# Patient Record
Sex: Male | Born: 1955
Health system: Southern US, Community
[De-identification: ages and names within clinical notes are randomized; demographics above are authoritative.]

## PROBLEM LIST (undated history)

## (undated) DIAGNOSIS — S060X9A Concussion with loss of consciousness of unspecified duration, initial encounter: Secondary | ICD-10-CM

## (undated) DIAGNOSIS — Z87442 Personal history of urinary calculi: Secondary | ICD-10-CM

## (undated) DIAGNOSIS — E7800 Pure hypercholesterolemia, unspecified: Secondary | ICD-10-CM

## (undated) DIAGNOSIS — I10 Essential (primary) hypertension: Secondary | ICD-10-CM

## (undated) DIAGNOSIS — M1990 Unspecified osteoarthritis, unspecified site: Secondary | ICD-10-CM

## (undated) HISTORY — PX: WRIST ARTHROSCOPY: SHX838

## (undated) HISTORY — PX: KNEE ARTHROSCOPY W/ MENISCAL REPAIR: SHX1877

## (undated) HISTORY — PX: APPENDECTOMY: SHX54

---

## 2011-09-18 HISTORY — PX: CARDIAC CATHETERIZATION: SHX172

## 2016-09-25 DIAGNOSIS — Z1389 Encounter for screening for other disorder: Secondary | ICD-10-CM | POA: Diagnosis not present

## 2016-09-25 DIAGNOSIS — M25561 Pain in right knee: Secondary | ICD-10-CM | POA: Diagnosis not present

## 2016-09-25 DIAGNOSIS — M25562 Pain in left knee: Secondary | ICD-10-CM | POA: Diagnosis not present

## 2016-09-25 DIAGNOSIS — I10 Essential (primary) hypertension: Secondary | ICD-10-CM | POA: Diagnosis not present

## 2016-09-25 DIAGNOSIS — K5909 Other constipation: Secondary | ICD-10-CM | POA: Diagnosis not present

## 2016-09-25 LAB — HEPATIC FUNCTION PANEL
ALT: 20 (ref 10–40)
AST: 18 (ref 14–40)
Bilirubin, Total: 0.3

## 2016-09-25 LAB — BASIC METABOLIC PANEL
Potassium: 4.5 (ref 3.4–5.3)
SODIUM: 138 (ref 137–147)

## 2016-09-25 LAB — TSH: TSH: 0.36 — AB (ref 0.41–5.90)

## 2016-11-14 DIAGNOSIS — R05 Cough: Secondary | ICD-10-CM | POA: Diagnosis not present

## 2016-11-14 DIAGNOSIS — J301 Allergic rhinitis due to pollen: Secondary | ICD-10-CM | POA: Diagnosis not present

## 2016-11-14 DIAGNOSIS — T781XXA Other adverse food reactions, not elsewhere classified, initial encounter: Secondary | ICD-10-CM | POA: Diagnosis not present

## 2016-11-14 DIAGNOSIS — J309 Allergic rhinitis, unspecified: Secondary | ICD-10-CM | POA: Diagnosis not present

## 2016-12-16 DIAGNOSIS — S060X9A Concussion with loss of consciousness of unspecified duration, initial encounter: Secondary | ICD-10-CM

## 2016-12-16 DIAGNOSIS — S060XAA Concussion with loss of consciousness status unknown, initial encounter: Secondary | ICD-10-CM

## 2016-12-16 HISTORY — DX: Concussion with loss of consciousness status unknown, initial encounter: S06.0XAA

## 2016-12-16 HISTORY — DX: Concussion with loss of consciousness of unspecified duration, initial encounter: S06.0X9A

## 2016-12-17 DIAGNOSIS — S0501XA Injury of conjunctiva and corneal abrasion without foreign body, right eye, initial encounter: Secondary | ICD-10-CM | POA: Diagnosis not present

## 2016-12-21 DIAGNOSIS — H1045 Other chronic allergic conjunctivitis: Secondary | ICD-10-CM | POA: Diagnosis not present

## 2016-12-26 ENCOUNTER — Ambulatory Visit (INDEPENDENT_AMBULATORY_CARE_PROVIDER_SITE_OTHER): Payer: BLUE CROSS/BLUE SHIELD | Admitting: Family Medicine

## 2016-12-26 ENCOUNTER — Ambulatory Visit (INDEPENDENT_AMBULATORY_CARE_PROVIDER_SITE_OTHER)
Admission: RE | Admit: 2016-12-26 | Discharge: 2016-12-26 | Disposition: A | Discharge status: Home / Self Care | Payer: BLUE CROSS/BLUE SHIELD | Source: Ambulatory Visit | Attending: Family Medicine | Admitting: Family Medicine

## 2016-12-26 ENCOUNTER — Ambulatory Visit
Admission: RE | Admit: 2016-12-26 | Discharge: 2016-12-26 | Disposition: A | Discharge status: Home / Self Care | Payer: BLUE CROSS/BLUE SHIELD | Source: Ambulatory Visit | Attending: Family Medicine | Admitting: Family Medicine

## 2016-12-26 VITALS — BP 100/70 | Ht 69.0 in | Wt 338.0 lb

## 2016-12-26 DIAGNOSIS — S060XAA Concussion with loss of consciousness status unknown, initial encounter: Secondary | ICD-10-CM | POA: Insufficient documentation

## 2016-12-26 DIAGNOSIS — R51 Headache: Principal | ICD-10-CM

## 2016-12-26 DIAGNOSIS — S060X0A Concussion without loss of consciousness, initial encounter: Secondary | ICD-10-CM

## 2016-12-26 DIAGNOSIS — S060X9A Concussion with loss of consciousness of unspecified duration, initial encounter: Secondary | ICD-10-CM | POA: Insufficient documentation

## 2016-12-26 DIAGNOSIS — R519 Headache, unspecified: Secondary | ICD-10-CM

## 2016-12-26 DIAGNOSIS — M47812 Spondylosis without myelopathy or radiculopathy, cervical region: Secondary | ICD-10-CM | POA: Diagnosis not present

## 2016-12-26 DIAGNOSIS — R42 Dizziness and giddiness: Secondary | ICD-10-CM | POA: Diagnosis not present

## 2016-12-26 NOTE — Progress Notes (Signed)
Subjective:     Chief Complaint: [name] Carroll, DOB: [birth year], is a 61 y.o. male who presents for a concussion sustained on Sunday, April 8th. He was reaching behind a file cabinet and hit his head on a shelf. Impact was on anterior/frontal portion of head. Patient did not lose consciousness but did have immediate dizziness and "saw stars". Over the past couple of days, patient has suffered nausea, headache, dizziness, photo and phonophobia, difficultly focusing, and slurring speech. Patient has a history of 5-6 concussions over the past 30 years. Patient had a loss of consciousness with one of his previous concussions. Patient does have a history of migraines and trouble sleeping.   Injury date : 12-23-2016 Visit #: 1  History of Present Illness:  Concussion Self-Reported Symptom Score Symptoms rated on a scale 1-6, in last 24 hours  Headache: 5  Nausea: 2  Vomiting: 0  Balance Difficulty: 5   Dizziness: 3  Fatigue: 5  Trouble Falling Asleep: 4   Sleep More Than Usual: 6  Sleep Less Than Usual: o  Daytime Drowsiness: 4  Photophobia: 4  Phonophobia: 4  Feeling anxious: 0  Confused: 2  Irritability: 0  Sadness: 0  Nervousness: 0  Feeling More Emotional: 0  Numbness or Tingling: 0  Feeling Slowed Down: 5  Feeling Mentally Foggy: 0  Difficulty Concentrating:6  Difficulty Remembering: 5  Visual Problems: 4     Total Symptom Score: 61   Review of Systems: Pertinent items are noted in HPI.  Review of History: Past Medical History: No past medical history on file. Hypertension, chronic reflex, obesity, hypercholesterol  Past Surgical History:  has no past surgical history on file. Not readily available Family History: family history is not on file. No rheumatological diseases Social History:  has no tobacco, alcohol, and drug history on file. Current Medications: has a current medication list which includes the following prescription(s): aspirin, famotidine, lactulose,  levocetirizine, lisinopril, misc natural products, montelukast, multivitamin, phentermine, simvastatin, spironolactone, and verapamil. Allergies: has no allergies on file. No known drug allergies  Objective:    Physical Examination Vitals:   12/26/16 1230  BP: 100/70   General appearance: alert, appears stated age and cooperative Head: Normocephalic, without obvious abnormality, atraumatic Eyes: conjunctivae/corneas clear. Pupils are not equal. Patient is more of a pinpoint pupils on the left side. Does not respond well to light. Sluggish response. Cicatrix rhythm. Lungs: clear to auscultation bilaterally and percussion Heart: regular rate and rhythm, S1, S2 normal, no murmur, click, rub or gallop Neurologic: CN 2-12 normal.  Sensation to pain, touch, and proprioception normal.  DTRs  normal in upper and lower extremities. No pathologic reflexes. Neg rhomberg, modified rhomberg, pronator drift, tandem gait, finger-to-nose; see post-concussion vestibular and oculomotor testing in chart Psychiatric: Oriented X3, intact recent and remote memory, judgement and insight, normal mood and affect mild weakness noted of the left hand but patient states that this is his baseline.  Concussion testing performed today:  Neurocognitive testing (ImPACT):   Post #1: 12/26/16   Verbal Memory Composite  78   Visual Memory Composite  67   Visual Motor Speed Composite  40   Reaction Time Composite  .77   Cognitive Efficiency Index  .24    Vestibular Screening:       Headache  Dizziness  Smooth Pursuits increased No change  H. Saccades increased No change  V. Saccades increased increased  H. VOR No change increased  V. VOR increased increased  Visual Motor Sensitivity increased increased  Accommodation Right: 33 cm Left: 40 cm increased No change  Convergence: 26 cm increased increased     Additional testing performed today:   Assessment:    Intractable headache, unspecified chronicity  pattern, unspecified headache type - Plan: DG Cervical Spine Complete, CT Head Wo Contrast  Jabe Jeanbaptiste presents with the following concussion subtypes. Cognitive Cervical Vestibular Ocular Migraine Anxiety/Mood   Plan:   Action/Discussion: Reviewed diagnosis, management options, expected outcomes, and the reasons for scheduled and emergent follow-up. Questions were adequately answered. Patient expressed verbal understanding and agreement with the following plan.     Patient Education:  Reviewed with patient the risks (i.e, a repeat concussion, post-concussion syndrome, second-impact syndrome) of returning to play prior to complete resolution, and thoroughly reviewed the signs and symptoms of concussion.Reviewed need for complete resolution of all symptoms, with rest AND exertion, prior to return to play.  Reviewed red flags for urgent medical evaluation: worsening symptoms, nausea/vomiting, intractable headache, musculoskeletal changes, focal neurological deficits.  [facility] Concussion Care Plan, which clearly outlines the plans stated above, was given to patient.  I was personally involved with the physical evaluation of and am in agreement with the assessment and treatment plan for this patient.  Greater than 50% of this encounter was spent in direct consultation with the patient in evaluation, counseling, and coordination of care. Duration of encounter: 50 minutes.  After Visit Summary printed out and provided to patient as appropriate.  This note is written by Judi Saa, in the presence of and acting as the scribe of Judi Saa, DO.

## 2016-12-26 NOTE — Assessment & Plan Note (Signed)
Patient did have a concussion longer than 72 hours ago. Continues to have a significant amount of symptoms. Patient's impact testing shows the patient does have significant difficulties with ocular, vestibular mostly. Concern though the patient does have more of a pinpoint pupil on the left side. States that this is different. Patient's headache is all on the left side in constant. Worse with repetitive movement or light. I do believe the patient should have further evaluation with a CT scan at this time. Patient will be scheduled in the next 24 hours. Patient knows if worsening symptoms to seek medical attention immediately. X-rays of neck ordered as well to rule out any other cervical pathology that can be contribute in. Close follow-up in the next 96 hours. Has different medications for breakthrough pain. Patient otherwise will stay out of work until we see him again.

## 2016-12-26 NOTE — Patient Instructions (Signed)
Good to see you I do think you have a concussion.  Out of work until Monday See me then (OK to double book) We need to get a CT of your head and neck xray. Xray today downstairs.  Once the CT is normal then do the following.   To help improve COGNITIVE function: Using fish oil/omega 3 that is 1000 mg (or roughly 600 mg EPA/DHA), starting as soon as possible after concussion, take: 3 tabs THREE TIMES a day  for the first 3 days, then (you will smell a little, sory) 3 tabs TWICE DAILY  for the next 3 days, then 3 tabs ONCE DAILY  for the next 10 days   To help reduce HEADACHES: Coenzyme Q10  ONCE DAILY Riboflavin/Vitamin B2  ONCE DAILY Magnesium oxide  ONCE - TWICE DAILY May stop after headaches are resolved.                                                                                             To help with INSOMNIA: Melatonin 3-5mg  AT BEDTIME   I want to see you again Monday (OK to double book)

## 2016-12-30 NOTE — Progress Notes (Signed)
Subjective:   @  Chief Complaint: [name] Carroll, DOB: 10-Sep-1956, is a 61 y.o. male who presents for follow-up for a concussion. He has had continued problems concentrating, headache and photophobia since his last visit. Head CT was negative.   Chief Complaint  Patient presents with  . Head Injury    Injury date : 4/ Visit #: 2  History of Present Illness:    Concussion Self-Reported Symptom Score Symptoms rated on a scale 1-6, in last 24 hours  Headache:4  Nausea: 2  Vomiting: 1  Balance Difficulty: 1  Dizziness: 1  Fatigue: 4  Trouble Falling Asleep: 0   Sleep More Than Usual: 2  Sleep Less Than Usual: 0  Daytime Drowsiness: 4  Photophobia: 3  Phonophobia: 3 Drowsiness: 4  Irritability: 0  Sadness: 0  Nervousness:2  Feeling More Emotional: 1  Numbness or Tingling: 1  Feeling Slowed Down: 3  Feeling Mentally Foggy: 0  Difficulty Concentrating: 4  Difficulty Remembering: 2  Visual Problems:0   Total Symptom Score: 39 Previous Symptom Score: 61  Review of Systems: Pertinent items are noted in HPI.  Review of History: Past Medical History: @  Past Surgical History:  has no past surgical history on file. Family History: family history is not on file. No family history of rheumatological diseases. Social History:  has no tobacco, alcohol, and drug history on file. Current Medications: has a current medication list which includes the following prescription(s): aspirin, famotidine, lactulose, levocetirizine, lisinopril, misc natural products, montelukast, multivitamin, phentermine, simvastatin, spironolactone, and verapamil. Allergies: has no allergies on file.  Objective:    Physical Examination Vitals:   12/31/16 1341  BP: 110/78  Pulse: 90   General appearance: alert, appears stated age and cooperative Head: Normocephalic, without obvious abnormality, atraumatic Eyes: conjunctivae/corneas clear. PERRL, EOM's intact saccadic movements  still noted. . Fundi benign. Sclera anicteric. Lungs: clear to auscultation bilaterally and percussion speak in full sentences without any shortness of breath Heart: regular rate and rhythm, S1, S2 normal, no murmur, click, rub or gallop Neurologic: CN 2-12 normal.  Sensation to pain, touch, and proprioception normal.  DTRs  normal in upper and lower extremities. No pathologic reflexes. Neg rhomberg, modified rhomberg, pronator drift, tandem gait, finger-to-nose; see post-concussion vestibular and oculomotor testing in chart Psychiatric: Oriented X3, intact recent and remote memory, judgement and insight, normal mood and affect  Concussion testing performed today:  Neurocognitive testing (ImPACT):   Post #2:    Verbal Memory Composite  86 (61%)   Visual Memory Composite  57 (21%)   Visual Motor Speed Composite  36.67 (75%)   Reaction Time Composite  .76 (22%)   Cognitive Efficiency Index  .21      Assessment:    No diagnosis found.  Cobey Raineri presents with the following concussion subtypes. Cognitive Cervical Vestibular Ocular Migraine Anxiety/Mood   Plan:    After Visit Summary printed out and provided to patient as appropriate.  This note is written by Judi Saa, in the presence of and acting as the scribe of Judi Saa, DO.

## 2016-12-31 ENCOUNTER — Ambulatory Visit (INDEPENDENT_AMBULATORY_CARE_PROVIDER_SITE_OTHER): Payer: BLUE CROSS/BLUE SHIELD | Admitting: Family Medicine

## 2016-12-31 DIAGNOSIS — S060X0D Concussion without loss of consciousness, subsequent encounter: Secondary | ICD-10-CM

## 2016-12-31 NOTE — Assessment & Plan Note (Signed)
Patient continues to have symptoms. We have seen some improvement but I do not feel that patient will be sent to work on a regular basis at this point. Patient still having some difficulty with concentration as well as a visual aspect. Patient does do a significant amount of difficulty with sitting at a computer and he did not think that patient would be to do this for 8 hours a day. Patient will be held out from another 72 hours or we will reevaluate again. Encourage him to continue the other therapies at this time. I believe the patient will improve we will need to be slowly. Once again follow-up in 72 hours.

## 2016-12-31 NOTE — Patient Instructions (Signed)
Good to see you  Not there yet Continue with the plan  See me again in

## 2017-01-03 ENCOUNTER — Ambulatory Visit (INDEPENDENT_AMBULATORY_CARE_PROVIDER_SITE_OTHER): Payer: BLUE CROSS/BLUE SHIELD | Admitting: Family Medicine

## 2017-01-03 DIAGNOSIS — S060X0D Concussion without loss of consciousness, subsequent encounter: Secondary | ICD-10-CM

## 2017-01-03 NOTE — Progress Notes (Signed)
Eat Subjective:   @  Chief Complaint: [name] Carroll, DOB: [birth year], is a 61 y.o. male who presents for follow-up for a concussion. Patient is here for third follow-up. Is doing significantly better at this time. No significant findings. Feels like himself over the last 24 hours.   Injury date : 4/ Visit #: 3  History of Present Illness:    Concussion Self-Reported Symptom Score Symptoms rated on a scale 1-6, in last 24 hours  Headache:0  Nausea: 0  Vomiting: 0  Balance Difficulty: 0  Dizziness: 0  Fatigue: 0  Trouble Falling Asleep: 0   Sleep More Than Usual: 0  Sleep Less Than Usual: 0  Daytime Drowsiness: 0  Photophobia: 0  Phonophobia: 0 Drowsiness: 0  Irritability: 0  Sadness: 0  Nervousness:0  Feeling More Emotional: 0  Numbness or Tingling: 0  Feeling Slowed Down: 0  Feeling Mentally Foggy: 0  Difficulty Concentrating: 0  Difficulty Remembering: 0  Visual Problems:0   Total Symptom Score: 0 Previous Symptom Score: 39  Review of Systems: Pertinent items are noted in HPI.  Review of History: Past Medical History: @  Past Surgical History:  has no past surgical history on file. Family History: family history is not on file. No family history of rheumatological diseases. Social History:  has no tobacco, alcohol, and drug history on file. Current Medications: has a current medication list which includes the following prescription(s): aspirin, famotidine, lactulose, levocetirizine, lisinopril, misc natural products, montelukast, multivitamin, phentermine, simvastatin, spironolactone, and verapamil. Allergies: has no allergies on file.  Objective:    Physical Examination Vitals:   01/03/17 1242  BP: 108/84  Pulse: 82   General appearance: alert, appears stated age and cooperative Head: Normocephalic, without obvious abnormality, atraumatic Eyes: conjunctivae/corneas clear. PERRL, EOM's intact saccadic movements still noted. . Fundi  benign. Sclera anicteric. Lungs: clear to auscultation bilaterally and percussion speak in full sentences without any shortness of breath Heart: regular rate and rhythm, S1, S2 normal, no murmur, click, rub or gallop Neurologic: CN 2-12 normal.  Sensation to pain, touch, and proprioception normal.  DTRs  normal in upper and lower extremities. No pathologic reflexes. Neg rhomberg, modified rhomberg, pronator drift, tandem gait, finger-to-nose; see post-concussion vestibular and oculomotor testing in chart Psychiatric: Oriented X3, intact recent and remote memory, judgement and insight, normal mood and affect  Concussion testing performed today:  Neurocognitive testing (ImPACT):   Post #2:  Post #3  Verbal Memory Composite  86 (61%) 79 (41%)  Visual Memory Composite  57 (21%) 63 (33%)  Visual Motor Speed Composite  36.67 (75%) 41.28 (90%)  Reaction Time Composite  .76 (22%) .69 (40%)  Cognitive Efficiency Index  .21 .24     Assessment:    No diagnosis found. Resolved  [name] Carroll presents with the following concussion subtypes. Cognitive Cervical Vestibular Ocular Migraine Anxiety/Mood   Plan:    After Visit Summary printed out and provided to patient as appropriate.  This note is written by Judi Saa, in the presence of and acting as the scribe of Judi Saa, DO.

## 2017-01-03 NOTE — Assessment & Plan Note (Signed)
Patient seems to be asymptomatic at this time. Responded well to conservative therapy otherwise. Is able to return to work starting tomorrow. Follow-up again as needed.

## 2017-01-03 NOTE — Patient Instructions (Signed)
Great to see you  You are great  See me when you need me Fish oil 2 grams daily

## 2017-01-15 ENCOUNTER — Ambulatory Visit: Payer: Self-pay | Admitting: Family Medicine

## 2017-03-14 DIAGNOSIS — Z125 Encounter for screening for malignant neoplasm of prostate: Secondary | ICD-10-CM | POA: Diagnosis not present

## 2017-03-14 DIAGNOSIS — I10 Essential (primary) hypertension: Secondary | ICD-10-CM | POA: Diagnosis not present

## 2017-03-14 DIAGNOSIS — Z0000 Encounter for general adult medical examination without abnormal findings: Secondary | ICD-10-CM | POA: Diagnosis not present

## 2017-03-14 DIAGNOSIS — E784 Other hyperlipidemia: Secondary | ICD-10-CM | POA: Diagnosis not present

## 2017-03-14 LAB — BASIC METABOLIC PANEL
BUN: 15 (ref 4–21)
CREATININE: 1.3 (ref <=1.3)
Glucose: 97
POTASSIUM: 5.1 (ref 3.4–5.3)
Sodium: 135 — AB (ref 137–147)

## 2017-03-14 LAB — CBC AND DIFFERENTIAL
HCT: 50 (ref 41–53)
HEMATOCRIT: 50 (ref 41–53)
Hemoglobin: 16.1 (ref 13.5–17.5)
Hemoglobin: 16.1 (ref 13.5–17.5)
PLATELETS: 167 (ref 150–399)
WBC: 135
WBC: 6.2

## 2017-03-14 LAB — HEPATIC FUNCTION PANEL
ALK PHOS: 80 (ref 25–125)
ALT: 32 (ref 10–40)
AST: 29 (ref 14–40)
BILIRUBIN, TOTAL: 0.6

## 2017-03-14 LAB — LIPID PANEL
Cholesterol: 237 — AB (ref 0–200)
HDL: 39 (ref 35–70)
LDL Cholesterol: 162
TRIGLYCERIDES: 178 — AB (ref 40–160)

## 2017-03-14 LAB — PSA: PSA: 0.775

## 2017-03-21 DIAGNOSIS — R202 Paresthesia of skin: Secondary | ICD-10-CM | POA: Diagnosis not present

## 2017-03-21 DIAGNOSIS — E784 Other hyperlipidemia: Secondary | ICD-10-CM | POA: Diagnosis not present

## 2017-03-21 DIAGNOSIS — Z0000 Encounter for general adult medical examination without abnormal findings: Secondary | ICD-10-CM | POA: Diagnosis not present

## 2017-03-21 DIAGNOSIS — M1990 Unspecified osteoarthritis, unspecified site: Secondary | ICD-10-CM | POA: Diagnosis not present

## 2017-03-25 DIAGNOSIS — Z1212 Encounter for screening for malignant neoplasm of rectum: Secondary | ICD-10-CM | POA: Diagnosis not present

## 2017-03-25 LAB — IFOBT (OCCULT BLOOD): IMMUNOLOGICAL FECAL OCCULT BLOOD TEST: NEGATIVE

## 2017-04-11 ENCOUNTER — Encounter (INDEPENDENT_AMBULATORY_CARE_PROVIDER_SITE_OTHER): Payer: Self-pay | Admitting: Orthopedic Surgery

## 2017-04-11 ENCOUNTER — Ambulatory Visit (INDEPENDENT_AMBULATORY_CARE_PROVIDER_SITE_OTHER): Payer: BLUE CROSS/BLUE SHIELD | Admitting: Orthopedic Surgery

## 2017-04-11 ENCOUNTER — Ambulatory Visit (INDEPENDENT_AMBULATORY_CARE_PROVIDER_SITE_OTHER): Payer: Self-pay

## 2017-04-11 VITALS — Ht 69.0 in | Wt 342.0 lb

## 2017-04-11 DIAGNOSIS — M25561 Pain in right knee: Secondary | ICD-10-CM | POA: Diagnosis not present

## 2017-04-11 DIAGNOSIS — M170 Bilateral primary osteoarthritis of knee: Secondary | ICD-10-CM | POA: Diagnosis not present

## 2017-04-11 DIAGNOSIS — M25562 Pain in left knee: Secondary | ICD-10-CM

## 2017-04-11 DIAGNOSIS — G8929 Other chronic pain: Secondary | ICD-10-CM | POA: Diagnosis not present

## 2017-04-11 NOTE — Progress Notes (Signed)
   Office Visit Note   Patient: [name] ManesRoger Ungerer           Date of Birth: 1956/07/13           MRN: [id number] Visit Date: 04/11/2017              Requested by: No referring provider defined for this encounter. PCP: Jarome MatinPaterson, [name], MD  Chief Complaint  Patient presents with  . Left Knee - Pain  . Right Knee - Pain      HPI: Patient is a 61 year old gentleman with chronic osteoarthritis both knees right worse than left. Patient has pain with activities of daily living he states his knees give out he falls he has difficulty going up and down stairs he has had steroid injections without relief has used anti-inflammatories without relief. Patient complains of crepitation with range of motion.  Patient denies history of diabetes denies history of sleep apnea.  Assessment & Plan: Visit Diagnoses:  1. Chronic pain of right knee   2. Chronic pain of left knee   3. Bilateral primary osteoarthritis of knee     Plan: Patient states that due to failure of conservative treatment he would like to proceed with total knee arthroplasty. We'll set him up for a right total knee arthroplasty risk and benefits were discussed patient states he understands wishes to proceed at this time patient states he has good family support and will not need discharge to skilled nursing.  Follow-Up Instructions: Return in about 2 weeks (around 04/25/2017).   Ortho Exam  Patient is alert, oriented, no adenopathy, well-dressed, normal affect, normal respiratory effort. Examination patient has an antalgic gait. He is crepitation range of motion of both knees he has valgus alignment to both knees with tenderness to palpation the patellofemoral joint as well as medial lateral joint does have a mild effusion. There is no skin breakdown.  Imaging: Xr Knee 1-2 Views Left  Result Date: 04/11/2017 2 view radiographs of left knee shows tricompartmental arthritic spurs joint space narrowing valgus alignment with subcondylar  sclerosis.  Xr Knee 1-2 Views Right  Result Date: 04/11/2017 2 view radiographs of the right knee shows tricompartmental osteoarthritis with bony spurs subcondylar sclerosis and valgus alignment with joint space narrowing of all compartments.   Labs: No results found for: HGBA1C, ESRSEDRATE, CRP, LABURIC, REPTSTATUS, GRAMSTAIN, CULT, LABORGA  Orders:  Orders Placed This Encounter  Procedures  . XR Knee 1-2 Views Right  . XR Knee 1-2 Views Left   No orders of the defined types were placed in this encounter.    Procedures: No procedures performed  Clinical Data: No additional findings.  ROS:  All other systems negative, except as noted in the HPI. Review of Systems  Objective: Vital Signs: Ht 5\' 9"  (1.753 m)   Wt (!) 342 lb (155.1 kg)   BMI 50.50 kg/m   Specialty Comments:  No specialty comments available.  PMFS History: Patient Active Problem List   Diagnosis Date Noted  . Bilateral primary osteoarthritis of knee 04/11/2017  . Concussion 12/26/2016   History reviewed. No pertinent past medical history.  History reviewed. No pertinent family history.  History reviewed. No pertinent surgical history. Social History   Occupational History  . Not on file.   Social History Main Topics  . Smoking status: Never Smoker  . Smokeless tobacco: Never Used  . Alcohol use Not on file  . Drug use: Unknown  . Sexual activity: Not on file

## 2017-04-19 ENCOUNTER — Telehealth (INDEPENDENT_AMBULATORY_CARE_PROVIDER_SITE_OTHER): Payer: Self-pay | Admitting: Orthopedic Surgery

## 2017-04-19 NOTE — Telephone Encounter (Signed)
Patient called needing a letter stating when he is having surgery and how long he will be out of work after the surgery for his job. Please give pt a call when this letter is ready.

## 2017-04-22 NOTE — Telephone Encounter (Signed)
Recommend out of work for 4 weeks could allow him to return to work sooner if he feels comfortable

## 2017-04-23 ENCOUNTER — Encounter: Payer: Self-pay | Admitting: Internal Medicine

## 2017-04-23 NOTE — Telephone Encounter (Signed)
Cancel patient's surgery and let him know that we can rebook his surgery at his convenience. Patient's out of work time is dependent upon his progress with therapy.

## 2017-04-23 NOTE — Telephone Encounter (Signed)
I advised patient of message below. He would like to cancel surgery. He states there is no way he could be out of work for 4 weeks. I advised him this was just an estimate and he maybe able to return much sooner, but we could not anticipate how he would be feeling before the surgery has happened. Advised him that rehab for a total joint replacement is involved. He will need to be doing physical therapy within his home and outside of his home. He explained he has to work 40 hours a week no matter what. I advised him I would let you and Elnita MaxwellCheryl know.

## 2017-04-29 NOTE — Telephone Encounter (Signed)
Patient called back and advised me to not cancel surgery. He would like to proceed as scheduled for Sept.

## 2017-05-02 ENCOUNTER — Other Ambulatory Visit (INDEPENDENT_AMBULATORY_CARE_PROVIDER_SITE_OTHER): Payer: Self-pay | Admitting: Family

## 2017-05-13 ENCOUNTER — Other Ambulatory Visit (INDEPENDENT_AMBULATORY_CARE_PROVIDER_SITE_OTHER): Payer: Self-pay | Admitting: Family

## 2017-05-16 ENCOUNTER — Other Ambulatory Visit (INDEPENDENT_AMBULATORY_CARE_PROVIDER_SITE_OTHER): Payer: Self-pay | Admitting: Orthopedic Surgery

## 2017-05-31 ENCOUNTER — Encounter (HOSPITAL_COMMUNITY): Payer: Self-pay

## 2017-05-31 ENCOUNTER — Encounter (HOSPITAL_COMMUNITY)
Admission: RE | Admit: 2017-05-31 | Discharge: 2017-05-31 | Disposition: A | Discharge status: Home / Self Care | Payer: BLUE CROSS/BLUE SHIELD | Source: Ambulatory Visit | Attending: Orthopedic Surgery | Admitting: Orthopedic Surgery

## 2017-05-31 DIAGNOSIS — S060X9A Concussion with loss of consciousness of unspecified duration, initial encounter: Secondary | ICD-10-CM | POA: Diagnosis not present

## 2017-05-31 DIAGNOSIS — Z01812 Encounter for preprocedural laboratory examination: Secondary | ICD-10-CM | POA: Insufficient documentation

## 2017-05-31 DIAGNOSIS — M170 Bilateral primary osteoarthritis of knee: Secondary | ICD-10-CM | POA: Insufficient documentation

## 2017-05-31 DIAGNOSIS — Z01810 Encounter for preprocedural cardiovascular examination: Secondary | ICD-10-CM | POA: Diagnosis not present

## 2017-05-31 DIAGNOSIS — I10 Essential (primary) hypertension: Secondary | ICD-10-CM | POA: Diagnosis not present

## 2017-05-31 HISTORY — DX: Essential (primary) hypertension: I10

## 2017-05-31 HISTORY — DX: Personal history of urinary calculi: Z87.442

## 2017-05-31 HISTORY — DX: Unspecified osteoarthritis, unspecified site: M19.90

## 2017-05-31 LAB — BASIC METABOLIC PANEL
ANION GAP: 8 (ref 5–15)
BUN: 15 mg/dL (ref 6–20)
CALCIUM: 9.2 mg/dL (ref 8.9–10.3)
CO2: 24 mmol/L (ref 22–32)
CREATININE: 1.34 mg/dL — AB (ref 0.61–1.24)
Chloride: 100 mmol/L — ABNORMAL LOW (ref 101–111)
GFR calc Af Amer: >60 mL/min (ref >60)
GFR, EST NON AFRICAN AMERICAN: 56 mL/min — AB (ref >60)
Glucose, Bld: 97 mg/dL (ref 65–99)
POTASSIUM: 4 mmol/L (ref 3.5–5.1)
Sodium: 132 mmol/L — ABNORMAL LOW (ref 135–145)

## 2017-05-31 LAB — CBC
HEMATOCRIT: 45.5 % (ref 39.0–52.0)
Hemoglobin: 14.9 g/dL (ref 13.0–17.0)
MCH: 28 pg (ref 26.0–34.0)
MCHC: 32.7 g/dL (ref 30.0–36.0)
MCV: 85.5 fL (ref 78.0–100.0)
Platelets: 167 10*3/uL (ref 150–400)
RBC: 5.32 MIL/uL (ref 4.22–5.81)
RDW: 14.7 % (ref 11.5–15.5)
WBC: 5.9 10*3/uL (ref 4.0–10.5)

## 2017-05-31 LAB — SURGICAL PCR SCREEN
MRSA, PCR: NEGATIVE
STAPHYLOCOCCUS AUREUS: NEGATIVE

## 2017-05-31 NOTE — Progress Notes (Signed)
PCP: Dr. Jarome Matin  Cardiologist: pt denies  EKG: denies past year  Stress test: 20 + years ago  ECHO: 2015 in [place] (requested)  Cardiac Cath:2015 in [place] (requested)  Chest x-ray: denies past year, no recent respiratory complications/infections

## 2017-05-31 NOTE — Pre-Procedure Instructions (Signed)
[name] Carroll  05/31/2017      [id number] [place] - [address] [place] [address] Phone: [phone] Fax: [phone]    Your procedure is scheduled on June 12, 2017.  Report to [facility] Admitting at 530 AM.  Call this number if you have problems the morning of surgery:  [phone]   Remember:  Do not eat food or drink liquids after midnight.  Take these medicines the morning of surgery with A SIP OF WATER azelastine (astelin) 0.1% nasal spray, famotidine (pepcid).   7 days prior to surgery STOP taking any Aspirin, Aleve, Naproxen, Ibuprofen, Motrin, Advil, Goody's, BC's, all herbal medications, fish oil, and all vitamins   Do not wear jewelry, make-up or nail polish.  Do not wear lotions, powders, or perfumes, or deoderant.  Men may shave face and neck.  Do not bring valuables to the hospital.  [facility] is not responsible for any belongings or valuables.  Contacts, dentures or bridgework may not be worn into surgery.  Leave your suitcase in the car.  After surgery it may be brought to your room.  For patients admitted to the hospital, discharge time will be determined by your treatment team.  Patients discharged the day of surgery will not be allowed to drive home.   Special instructions:   [facility]- Preparing For Surgery  Before surgery, you can play an important role. Because skin is not sterile, your skin needs to be as free of germs as possible. You can reduce the number of germs on your skin by washing with CHG (chlorahexidine gluconate) Soap before surgery.  CHG is an antiseptic cleaner which kills germs and bonds with the skin to continue killing germs even after washing.  Please do not use if you have an allergy to CHG or antibacterial soaps. If your skin becomes reddened/irritated stop using the CHG.  Do not shave (including legs and underarms) for at least 48 hours prior to first CHG  shower. It is OK to shave your face.  Please follow these instructions carefully.   1. Shower the NIGHT BEFORE SURGERY and the MORNING OF SURGERY with CHG.   2. If you chose to wash your hair, wash your hair first as usual with your normal shampoo.  3. After you shampoo, rinse your hair and body thoroughly to remove the shampoo.  4. Use CHG as you would any other liquid soap. You can apply CHG directly to the skin and wash gently with a scrungie or a clean washcloth.   5. Apply the CHG Soap to your body ONLY FROM THE NECK DOWN.  Do not use on open wounds or open sores. Avoid contact with your eyes, ears, mouth and genitals (private parts). Wash genitals (private parts) with your normal soap.  6. Wash thoroughly, paying special attention to the area where your surgery will be performed.  7. Thoroughly rinse your body with warm water from the neck down.  8. DO NOT shower/wash with your normal soap after using and rinsing off the CHG Soap.  9. Pat yourself dry with a CLEAN TOWEL.   10. Wear CLEAN PAJAMAS   11. Place CLEAN SHEETS on your bed the night of your first shower and DO NOT SLEEP WITH PETS.    Day of Surgery: Do not apply any deodorants/lotions. Please wear clean clothes to the hospital/surgery center.     Please read over the following fact sheets that you  were given. Pain Booklet, Coughing and Deep Breathing, MRSA Information and Surgical Site Infection Prevention

## 2017-06-02 DIAGNOSIS — Z23 Encounter for immunization: Secondary | ICD-10-CM | POA: Diagnosis not present

## 2017-06-03 NOTE — Progress Notes (Addendum)
Anesthesia Chart Review:  Pt is a 61 year old male scheduled for R total knee arthroplasty on 06/12/2017 with Aldean Baker, MD  PMH includes:  HTN. Never smoker. BMI 52  Medications include: ASA 81 mg, Vytorin, Pepcid, lisinopril, Adipex, spironolactone, verapamil.  Pt stopped adipex a week ago.    Preoperative labs reviewed.    EKG 05/31/17: NSR. Inferior infarct, age undetermined.  Appears stable when compared to EKG 02/08/13 [facility], [place], [place])  Echo 02/09/13 ([facility], [place]): 1. Technically difficult study with suboptimal views. 2. LV size normal. Mild concentric LVH. Overall LV systolic function normal, EF 55-60%. Diastolic filling pattern indicates impaired relaxation. 3. LA mildly dilated. 4. RV mildly enlarged measuring between 3.4 3.7 cm. RV systolic function normal.  Cardiac cath [facility], [place], [place]):  1. RCA: Dominant. Free of significant disease. Distally divides into PDA and PL branches which are free of significant flow-limiting lesion. 2. LM: A short segment. Free of significant disease. 3. LAD: Free of significant disease. It is tortuous. Distally the LAD supplies apex. It gives rise to 2 diagonals, which are free of significant disease. 4. CX: Tortuous. Free of significant disease. Gives rise to to OM vessels. OM1 gives rise to superior and inferior branch, free of significant disease. AV groove circumflex continues as the second OM and is free of significant flow-limiting lesion. 5. Left ventriculogram: Normal LV size and function was mildly dilated LV with normal LV function. Estimated EF 65% with LVEDP of 18. Conclusion: Normal coronaries. Noncardiac chest pain workup  If no changes, I anticipate pt can proceed with surgery as scheduled.   Rica Mast, FNP-BC [facility]/Anesthesiology Phone: [phone] 06/03/2017 4:44 PM

## 2017-06-11 ENCOUNTER — Encounter (HOSPITAL_COMMUNITY): Payer: Self-pay | Admitting: Anesthesiology

## 2017-06-11 MED ORDER — CEFAZOLIN SODIUM 10 G IJ SOLR
3.0000 g | INTRAMUSCULAR | Status: AC
  Administered 2017-06-12: 3 g via INTRAVENOUS
  Filled 2017-06-11: qty 3000

## 2017-06-11 NOTE — Anesthesia Preprocedure Evaluation (Addendum)
Anesthesia Evaluation  Patient identified by MRN, date of birth, ID band Patient awake    Reviewed: Allergy & Precautions, NPO status , Patient's Chart, lab work & pertinent test results  Airway Mallampati: II  TM Distance: >3 FB Neck ROM: Full    Dental  (+) Upper Dentures, Partial Lower   Pulmonary neg pulmonary ROS,    breath sounds clear to auscultation       Cardiovascular hypertension, Pt. on medications  Rhythm:Regular Rate:Normal     Neuro/Psych negative neurological ROS     GI/Hepatic negative GI ROS, Neg liver ROS,   Endo/Other  negative endocrine ROS  Renal/GU negative Renal ROS     Musculoskeletal  (+) Arthritis ,   Abdominal   Peds  Hematology negative hematology ROS (+)   Anesthesia Other Findings Day of surgery medications reviewed with the patient.  Reproductive/Obstetrics                            Anesthesia Physical Anesthesia Plan  ASA: III  Anesthesia Plan: General   Post-op Pain Management:  Regional for Post-op pain   Induction: Intravenous  PONV Risk Score and Plan: 3 and Ondansetron, Dexamethasone, Midazolam and Treatment may vary due to age or medical condition  Airway Management Planned: Oral ETT  Additional Equipment:   Intra-op Plan:   Post-operative Plan: Extubation in OR  Informed Consent: I have reviewed the patients History and Physical, chart, labs and discussed the procedure including the risks, benefits and alternatives for the proposed anesthesia with the patient or authorized representative who has indicated his/her understanding and acceptance.   Dental advisory given  Plan Discussed with: CRNA  Anesthesia Plan Comments:        Anesthesia Quick Evaluation

## 2017-06-12 ENCOUNTER — Inpatient Hospital Stay (HOSPITAL_COMMUNITY): Payer: BLUE CROSS/BLUE SHIELD | Admitting: Emergency Medicine

## 2017-06-12 ENCOUNTER — Inpatient Hospital Stay (HOSPITAL_COMMUNITY)
Admission: RE | Admit: 2017-06-12 | Discharge: 2017-06-14 | DRG: 470 | Disposition: A | Discharge status: Home / Self Care | Payer: BLUE CROSS/BLUE SHIELD | Source: Ambulatory Visit | Attending: Orthopedic Surgery | Admitting: Orthopedic Surgery

## 2017-06-12 ENCOUNTER — Encounter (HOSPITAL_COMMUNITY)
Admission: RE | Disposition: A | Discharge status: Home / Self Care | Payer: Self-pay | Source: Ambulatory Visit | Attending: Orthopedic Surgery

## 2017-06-12 ENCOUNTER — Inpatient Hospital Stay (HOSPITAL_COMMUNITY): Payer: BLUE CROSS/BLUE SHIELD | Admitting: Anesthesiology

## 2017-06-12 ENCOUNTER — Encounter (HOSPITAL_COMMUNITY): Payer: Self-pay | Admitting: Urology

## 2017-06-12 DIAGNOSIS — Z7951 Long term (current) use of inhaled steroids: Secondary | ICD-10-CM

## 2017-06-12 DIAGNOSIS — Z7982 Long term (current) use of aspirin: Secondary | ICD-10-CM

## 2017-06-12 DIAGNOSIS — Z87442 Personal history of urinary calculi: Secondary | ICD-10-CM | POA: Diagnosis not present

## 2017-06-12 DIAGNOSIS — M879 Osteonecrosis, unspecified: Secondary | ICD-10-CM | POA: Diagnosis not present

## 2017-06-12 DIAGNOSIS — S060X9A Concussion with loss of consciousness of unspecified duration, initial encounter: Secondary | ICD-10-CM | POA: Diagnosis not present

## 2017-06-12 DIAGNOSIS — Z96651 Presence of right artificial knee joint: Secondary | ICD-10-CM

## 2017-06-12 DIAGNOSIS — Z882 Allergy status to sulfonamides status: Secondary | ICD-10-CM

## 2017-06-12 DIAGNOSIS — M1711 Unilateral primary osteoarthritis, right knee: Secondary | ICD-10-CM

## 2017-06-12 DIAGNOSIS — M170 Bilateral primary osteoarthritis of knee: Secondary | ICD-10-CM | POA: Diagnosis not present

## 2017-06-12 DIAGNOSIS — G8918 Other acute postprocedural pain: Secondary | ICD-10-CM | POA: Diagnosis not present

## 2017-06-12 DIAGNOSIS — M25561 Pain in right knee: Secondary | ICD-10-CM | POA: Diagnosis not present

## 2017-06-12 DIAGNOSIS — I10 Essential (primary) hypertension: Secondary | ICD-10-CM | POA: Diagnosis present

## 2017-06-12 HISTORY — PX: TOTAL KNEE ARTHROPLASTY: SHX125

## 2017-06-12 HISTORY — DX: Pure hypercholesterolemia, unspecified: E78.00

## 2017-06-12 HISTORY — DX: Concussion with loss of consciousness of unspecified duration, initial encounter: S06.0X9A

## 2017-06-12 SURGERY — ARTHROPLASTY, KNEE, TOTAL
Anesthesia: General | Site: Knee | Laterality: Right

## 2017-06-12 MED ORDER — PHENOL 1.4 % MT LIQD: 1.0000 | OROMUCOSAL | Status: DC | PRN

## 2017-06-12 MED ORDER — SODIUM CHLORIDE 0.9 % IR SOLN
Status: DC | PRN
  Administered 2017-06-12: 3000 mL

## 2017-06-12 MED ORDER — VERAPAMIL HCL ER 240 MG PO TBCR
240.0000 mg | EXTENDED_RELEASE_TABLET | Freq: Every day | ORAL | Status: DC
  Administered 2017-06-12 – 2017-06-13 (×2): 240 mg via ORAL
  Filled 2017-06-12 (×2): qty 1

## 2017-06-12 MED ORDER — ONDANSETRON HCL 4 MG/2ML IJ SOLN
INTRAMUSCULAR | Status: DC | PRN
  Administered 2017-06-12: 4 mg via INTRAVENOUS

## 2017-06-12 MED ORDER — METOCLOPRAMIDE HCL 5 MG PO TABS: 5.0000 mg | ORAL_TABLET | Freq: Three times a day (TID) | ORAL | Status: DC | PRN

## 2017-06-12 MED ORDER — SUCCINYLCHOLINE CHLORIDE 200 MG/10ML IV SOSY
PREFILLED_SYRINGE | INTRAVENOUS | Status: AC
  Filled 2017-06-12: qty 10

## 2017-06-12 MED ORDER — ACETAMINOPHEN 325 MG PO TABS: 650.0000 mg | ORAL_TABLET | Freq: Four times a day (QID) | ORAL | Status: DC | PRN

## 2017-06-12 MED ORDER — ONDANSETRON HCL 4 MG/2ML IJ SOLN
4.0000 mg | Freq: Four times a day (QID) | INTRAMUSCULAR | Status: DC | PRN
  Administered 2017-06-13 – 2017-06-14 (×2): 4 mg via INTRAVENOUS
  Filled 2017-06-12 (×2): qty 2

## 2017-06-12 MED ORDER — MIDAZOLAM HCL 2 MG/2ML IJ SOLN
INTRAMUSCULAR | Status: DC | PRN
  Administered 2017-06-12: 2 mg via INTRAVENOUS

## 2017-06-12 MED ORDER — HYDROMORPHONE HCL 1 MG/ML IJ SOLN
0.2500 mg | INTRAMUSCULAR | Status: DC | PRN
  Administered 2017-06-12: 0.5 mg via INTRAVENOUS
  Administered 2017-06-12 (×2): 0.25 mg via INTRAVENOUS

## 2017-06-12 MED ORDER — EZETIMIBE-SIMVASTATIN 10-40 MG PO TABS: 1.0000 | ORAL_TABLET | Freq: Every day | ORAL | Status: DC

## 2017-06-12 MED ORDER — PROPOFOL 10 MG/ML IV BOLUS
INTRAVENOUS | Status: AC
  Filled 2017-06-12: qty 20

## 2017-06-12 MED ORDER — ONDANSETRON HCL 4 MG PO TABS: 4.0000 mg | ORAL_TABLET | Freq: Four times a day (QID) | ORAL | Status: DC | PRN

## 2017-06-12 MED ORDER — CEFAZOLIN SODIUM-DEXTROSE 2-4 GM/100ML-% IV SOLN
2.0000 g | Freq: Four times a day (QID) | INTRAVENOUS | Status: AC
  Administered 2017-06-12 (×2): 2 g via INTRAVENOUS
  Filled 2017-06-12 (×2): qty 100

## 2017-06-12 MED ORDER — METHOCARBAMOL 500 MG PO TABS
ORAL_TABLET | ORAL | Status: AC
  Filled 2017-06-12: qty 1

## 2017-06-12 MED ORDER — PROPOFOL 10 MG/ML IV BOLUS
INTRAVENOUS | Status: AC
  Filled 2017-06-12: qty 40

## 2017-06-12 MED ORDER — ROPIVACAINE HCL 5 MG/ML IJ SOLN
INTRAMUSCULAR | Status: DC | PRN
  Administered 2017-06-12: 30 mL via PERINEURAL

## 2017-06-12 MED ORDER — ASPIRIN EC 325 MG PO TBEC
325.0000 mg | DELAYED_RELEASE_TABLET | Freq: Every day | ORAL | Status: DC
  Administered 2017-06-13 – 2017-06-14 (×2): 325 mg via ORAL
  Filled 2017-06-12 (×2): qty 1

## 2017-06-12 MED ORDER — EZETIMIBE 10 MG PO TABS
10.0000 mg | ORAL_TABLET | Freq: Every day | ORAL | Status: DC
  Administered 2017-06-12 – 2017-06-13 (×2): 10 mg via ORAL
  Filled 2017-06-12 (×2): qty 1

## 2017-06-12 MED ORDER — FAMOTIDINE 20 MG PO TABS
20.0000 mg | ORAL_TABLET | Freq: Every day | ORAL | Status: DC
  Administered 2017-06-12 – 2017-06-13 (×2): 20 mg via ORAL
  Filled 2017-06-12 (×2): qty 1

## 2017-06-12 MED ORDER — OXYCODONE HCL 5 MG PO TABS
5.0000 mg | ORAL_TABLET | ORAL | Status: DC | PRN
  Administered 2017-06-12 – 2017-06-14 (×13): 10 mg via ORAL
  Filled 2017-06-12 (×12): qty 2

## 2017-06-12 MED ORDER — HYDROMORPHONE HCL 1 MG/ML IJ SOLN
1.0000 mg | INTRAMUSCULAR | Status: DC | PRN
Start: 2017-06-12 — End: 2017-06-14
  Administered 2017-06-13 – 2017-06-14 (×4): 1 mg via INTRAVENOUS
  Filled 2017-06-12 (×4): qty 1

## 2017-06-12 MED ORDER — DEXAMETHASONE SODIUM PHOSPHATE 10 MG/ML IJ SOLN
INTRAMUSCULAR | Status: AC
  Filled 2017-06-12: qty 1

## 2017-06-12 MED ORDER — TRANEXAMIC ACID 1000 MG/10ML IV SOLN
2000.0000 mg | Freq: Once | INTRAVENOUS | Status: DC
  Filled 2017-06-12: qty 20

## 2017-06-12 MED ORDER — ACETAMINOPHEN 650 MG RE SUPP: 650.0000 mg | Freq: Four times a day (QID) | RECTAL | Status: DC | PRN

## 2017-06-12 MED ORDER — DOCUSATE SODIUM 100 MG PO CAPS
100.0000 mg | ORAL_CAPSULE | Freq: Two times a day (BID) | ORAL | Status: DC
  Administered 2017-06-12 – 2017-06-14 (×4): 100 mg via ORAL
  Filled 2017-06-12 (×4): qty 1

## 2017-06-12 MED ORDER — FENTANYL CITRATE (PF) 100 MCG/2ML IJ SOLN
INTRAMUSCULAR | Status: DC | PRN
  Administered 2017-06-12 (×4): 50 ug via INTRAVENOUS

## 2017-06-12 MED ORDER — PROPOFOL 10 MG/ML IV BOLUS
INTRAVENOUS | Status: DC | PRN
  Administered 2017-06-12: 50 mg via INTRAVENOUS
  Administered 2017-06-12: 70 mg via INTRAVENOUS
  Administered 2017-06-12: 200 mg via INTRAVENOUS

## 2017-06-12 MED ORDER — FENTANYL CITRATE (PF) 250 MCG/5ML IJ SOLN
INTRAMUSCULAR | Status: AC
  Filled 2017-06-12: qty 5

## 2017-06-12 MED ORDER — SODIUM CHLORIDE 0.9 % IV SOLN
INTRAVENOUS | Status: AC | PRN
  Administered 2017-06-12: 2000 mg via TOPICAL

## 2017-06-12 MED ORDER — LACTATED RINGERS IV SOLN: INTRAVENOUS | Status: DC

## 2017-06-12 MED ORDER — HYDROMORPHONE HCL 1 MG/ML IJ SOLN
INTRAMUSCULAR | Status: AC
  Filled 2017-06-12: qty 1

## 2017-06-12 MED ORDER — DEXAMETHASONE SODIUM PHOSPHATE 10 MG/ML IJ SOLN
INTRAMUSCULAR | Status: DC | PRN
  Administered 2017-06-12: 10 mg via INTRAVENOUS

## 2017-06-12 MED ORDER — METOCLOPRAMIDE HCL 5 MG/ML IJ SOLN: 5.0000 mg | Freq: Three times a day (TID) | INTRAMUSCULAR | Status: DC | PRN

## 2017-06-12 MED ORDER — EPHEDRINE SULFATE 50 MG/ML IJ SOLN
INTRAMUSCULAR | Status: AC
  Filled 2017-06-12: qty 1

## 2017-06-12 MED ORDER — LIDOCAINE HCL (CARDIAC) 20 MG/ML IV SOLN
INTRAVENOUS | Status: DC | PRN
  Administered 2017-06-12: 60 mg via INTRAVENOUS

## 2017-06-12 MED ORDER — METHOCARBAMOL 500 MG PO TABS
500.0000 mg | ORAL_TABLET | Freq: Four times a day (QID) | ORAL | Status: DC | PRN
  Administered 2017-06-12: 500 mg via ORAL

## 2017-06-12 MED ORDER — ONDANSETRON HCL 4 MG/2ML IJ SOLN
INTRAMUSCULAR | Status: AC
  Filled 2017-06-12: qty 2

## 2017-06-12 MED ORDER — SUCCINYLCHOLINE CHLORIDE 20 MG/ML IJ SOLN
INTRAMUSCULAR | Status: DC | PRN
  Administered 2017-06-12: 140 mg via INTRAVENOUS

## 2017-06-12 MED ORDER — KETOROLAC TROMETHAMINE 15 MG/ML IJ SOLN
15.0000 mg | Freq: Four times a day (QID) | INTRAMUSCULAR | Status: AC
  Administered 2017-06-12 – 2017-06-13 (×4): 15 mg via INTRAVENOUS
  Filled 2017-06-12 (×4): qty 1

## 2017-06-12 MED ORDER — MIDAZOLAM HCL 2 MG/2ML IJ SOLN
INTRAMUSCULAR | Status: AC
  Filled 2017-06-12: qty 2

## 2017-06-12 MED ORDER — LISINOPRIL 10 MG PO TABS
10.0000 mg | ORAL_TABLET | Freq: Every day | ORAL | Status: DC
  Administered 2017-06-12 – 2017-06-13 (×2): 10 mg via ORAL
  Filled 2017-06-12 (×2): qty 1

## 2017-06-12 MED ORDER — SODIUM CHLORIDE 0.9 % IV SOLN
INTRAVENOUS | Status: DC
  Administered 2017-06-12: 20:00:00 via INTRAVENOUS

## 2017-06-12 MED ORDER — TRANEXAMIC ACID 1000 MG/10ML IV SOLN
1000.0000 mg | INTRAVENOUS | Status: AC
  Administered 2017-06-12: 1000 mg via INTRAVENOUS
  Filled 2017-06-12 (×2): qty 10

## 2017-06-12 MED ORDER — SUGAMMADEX SODIUM 500 MG/5ML IV SOLN
INTRAVENOUS | Status: AC
  Filled 2017-06-12: qty 5

## 2017-06-12 MED ORDER — SPIRONOLACTONE 25 MG PO TABS
25.0000 mg | ORAL_TABLET | Freq: Every day | ORAL | Status: DC
  Administered 2017-06-14: 25 mg via ORAL
  Filled 2017-06-12 (×3): qty 1

## 2017-06-12 MED ORDER — ATORVASTATIN CALCIUM 20 MG PO TABS
20.0000 mg | ORAL_TABLET | Freq: Every day | ORAL | Status: DC
  Administered 2017-06-12 – 2017-06-13 (×2): 20 mg via ORAL
  Filled 2017-06-12 (×2): qty 1

## 2017-06-12 MED ORDER — PHENYLEPHRINE 40 MCG/ML (10ML) SYRINGE FOR IV PUSH (FOR BLOOD PRESSURE SUPPORT)
PREFILLED_SYRINGE | INTRAVENOUS | Status: AC
  Filled 2017-06-12: qty 10

## 2017-06-12 MED ORDER — BISACODYL 10 MG RE SUPP: 10.0000 mg | Freq: Every day | RECTAL | Status: DC | PRN

## 2017-06-12 MED ORDER — LACTATED RINGERS IV SOLN
INTRAVENOUS | Status: DC
  Administered 2017-06-12 (×3): via INTRAVENOUS

## 2017-06-12 MED ORDER — SUGAMMADEX SODIUM 500 MG/5ML IV SOLN
INTRAVENOUS | Status: DC | PRN
  Administered 2017-06-12: 300 mg via INTRAVENOUS

## 2017-06-12 MED ORDER — ROCURONIUM BROMIDE 100 MG/10ML IV SOLN
INTRAVENOUS | Status: DC | PRN
  Administered 2017-06-12: 50 mg via INTRAVENOUS

## 2017-06-12 MED ORDER — FENTANYL CITRATE (PF) 100 MCG/2ML IJ SOLN
INTRAMUSCULAR | Status: AC
  Filled 2017-06-12: qty 2

## 2017-06-12 MED ORDER — LORATADINE 10 MG PO TABS
10.0000 mg | ORAL_TABLET | Freq: Every day | ORAL | Status: DC
  Administered 2017-06-12 – 2017-06-13 (×2): 10 mg via ORAL
  Filled 2017-06-12 (×2): qty 1

## 2017-06-12 MED ORDER — LEVOCETIRIZINE DIHYDROCHLORIDE 5 MG PO TABS: 5.0000 mg | ORAL_TABLET | Freq: Every day | ORAL | Status: DC

## 2017-06-12 MED ORDER — CHLORHEXIDINE GLUCONATE 4 % EX LIQD: 60.0000 mL | Freq: Once | CUTANEOUS | Status: DC

## 2017-06-12 MED ORDER — PROMETHAZINE HCL 25 MG/ML IJ SOLN: 6.2500 mg | INTRAMUSCULAR | Status: DC | PRN

## 2017-06-12 MED ORDER — MAGNESIUM CITRATE PO SOLN: 1.0000 | Freq: Once | ORAL | Status: DC | PRN

## 2017-06-12 MED ORDER — 0.9 % SODIUM CHLORIDE (POUR BTL) OPTIME
TOPICAL | Status: DC | PRN
  Administered 2017-06-12: 1000 mL

## 2017-06-12 MED ORDER — MEPERIDINE HCL 25 MG/ML IJ SOLN: 6.2500 mg | INTRAMUSCULAR | Status: DC | PRN

## 2017-06-12 MED ORDER — OXYCODONE HCL 5 MG PO TABS
ORAL_TABLET | ORAL | Status: AC
  Filled 2017-06-12: qty 2

## 2017-06-12 MED ORDER — MENTHOL 3 MG MT LOZG
1.0000 | LOZENGE | OROMUCOSAL | Status: DC | PRN
  Administered 2017-06-14: 3 mg via ORAL
  Filled 2017-06-12 (×2): qty 9

## 2017-06-12 MED ORDER — POLYETHYLENE GLYCOL 3350 17 G PO PACK: 17.0000 g | PACK | Freq: Every day | ORAL | Status: DC | PRN

## 2017-06-12 SURGICAL SUPPLY — 60 items
BLADE SAGITTAL 25.0X1.19X90 (BLADE) ×2 IMPLANT
BLADE SAGITTAL 25.0X1.19X90MM (BLADE) ×1
BLADE SAW SGTL 13X75X1.27 (BLADE) ×3 IMPLANT
BLADE SURG 21 STRL SS (BLADE) ×6 IMPLANT
BNDG COHESIVE 6X5 TAN STRL LF (GAUZE/BANDAGES/DRESSINGS) ×3 IMPLANT
BNDG GAUZE ELAST 4 BULKY (GAUZE/BANDAGES/DRESSINGS) ×3 IMPLANT
BONE CEMENT PALACOSE (Cement) ×6 IMPLANT
BOWL SMART MIX CTS (DISPOSABLE) ×3 IMPLANT
CAP KNEE TOTAL 3 SIGMA ×3 IMPLANT
CEMENT BONE PALACOSE (Cement) ×2 IMPLANT
COVER SURGICAL LIGHT HANDLE (MISCELLANEOUS) ×6 IMPLANT
CUFF TOURNIQUET SINGLE 34IN LL (TOURNIQUET CUFF) ×3 IMPLANT
CUFF TOURNIQUET SINGLE 44IN (TOURNIQUET CUFF) IMPLANT
DRAPE EXTREMITY T 121X128X90 (DRAPE) ×3 IMPLANT
DRAPE HALF SHEET 40X57 (DRAPES) ×6 IMPLANT
DRAPE U-SHAPE 47X51 STRL (DRAPES) ×3 IMPLANT
DRSG ADAPTIC 3X8 NADH LF (GAUZE/BANDAGES/DRESSINGS) ×3 IMPLANT
DRSG PAD ABDOMINAL 8X10 ST (GAUZE/BANDAGES/DRESSINGS) ×3 IMPLANT
DURAPREP 26ML APPLICATOR (WOUND CARE) ×3 IMPLANT
ELECT REM PT RETURN 9FT ADLT (ELECTROSURGICAL) ×3
ELECTRODE REM PT RTRN 9FT ADLT (ELECTROSURGICAL) ×1 IMPLANT
FACESHIELD WRAPAROUND (MASK) ×3 IMPLANT
GAUZE SPONGE 4X4 12PLY STRL (GAUZE/BANDAGES/DRESSINGS) ×3 IMPLANT
GAUZE SPONGE 4X4 12PLY STRL LF (GAUZE/BANDAGES/DRESSINGS) ×3 IMPLANT
GLOVE BIOGEL PI IND STRL 6.5 (GLOVE) ×1 IMPLANT
GLOVE BIOGEL PI IND STRL 7.0 (GLOVE) ×1 IMPLANT
GLOVE BIOGEL PI IND STRL 9 (GLOVE) ×1 IMPLANT
GLOVE BIOGEL PI INDICATOR 6.5 (GLOVE) ×2
GLOVE BIOGEL PI INDICATOR 7.0 (GLOVE) ×2
GLOVE BIOGEL PI INDICATOR 9 (GLOVE) ×2
GLOVE SURG ORTHO 9.0 STRL STRW (GLOVE) ×3 IMPLANT
GLOVE SURG SS PI 6.5 STRL IVOR (GLOVE) ×9 IMPLANT
GOWN STRL REUS W/ TWL LRG LVL3 (GOWN DISPOSABLE) ×3 IMPLANT
GOWN STRL REUS W/ TWL XL LVL3 (GOWN DISPOSABLE) ×2 IMPLANT
GOWN STRL REUS W/TWL LRG LVL3 (GOWN DISPOSABLE) ×6
GOWN STRL REUS W/TWL XL LVL3 (GOWN DISPOSABLE) ×4
HANDPIECE INTERPULSE COAX TIP (DISPOSABLE) ×2
KIT BASIN OR (CUSTOM PROCEDURE TRAY) ×3 IMPLANT
KIT ROOM TURNOVER OR (KITS) ×3 IMPLANT
MANIFOLD NEPTUNE II (INSTRUMENTS) ×3 IMPLANT
NEEDLE SPNL 18GX3.5 QUINCKE PK (NEEDLE) ×3 IMPLANT
NS IRRIG 1000ML POUR BTL (IV SOLUTION) ×3 IMPLANT
PACK TOTAL JOINT (CUSTOM PROCEDURE TRAY) ×3 IMPLANT
PACK UNIVERSAL I (CUSTOM PROCEDURE TRAY) ×3 IMPLANT
PAD ABD 8X10 STRL (GAUZE/BANDAGES/DRESSINGS) ×3 IMPLANT
PAD ARMBOARD 7.5X6 YLW CONV (MISCELLANEOUS) ×3 IMPLANT
SET HNDPC FAN SPRY TIP SCT (DISPOSABLE) ×1 IMPLANT
STAPLER VISISTAT 35W (STAPLE) ×3 IMPLANT
SUCTION FRAZIER HANDLE 10FR (MISCELLANEOUS)
SUCTION TUBE FRAZIER 10FR DISP (MISCELLANEOUS) IMPLANT
SUT VIC AB 0 CT1 27 (SUTURE) ×2
SUT VIC AB 0 CT1 27XBRD ANBCTR (SUTURE) ×1 IMPLANT
SUT VIC AB 1 CTX 36 (SUTURE)
SUT VIC AB 1 CTX36XBRD ANBCTR (SUTURE) IMPLANT
SYR 50ML LL SCALE MARK (SYRINGE) ×3 IMPLANT
TOWEL OR 17X24 6PK STRL BLUE (TOWEL DISPOSABLE) ×3 IMPLANT
TOWEL OR 17X26 10 PK STRL BLUE (TOWEL DISPOSABLE) ×3 IMPLANT
TRAY CATH 16FR W/PLASTIC CATH (SET/KITS/TRAYS/PACK) IMPLANT
TRAY FOLEY W/METER SILVER 16FR (SET/KITS/TRAYS/PACK) IMPLANT
WRAP KNEE MAXI GEL POST OP (GAUZE/BANDAGES/DRESSINGS) ×3 IMPLANT

## 2017-06-12 NOTE — Discharge Instructions (Signed)

## 2017-06-12 NOTE — Op Note (Signed)
DATE OF SURGERY:  06/12/2017  TIME: 10:01 AM  PATIENT NAME:  [name] Carroll    AGE: 61 y.o.    PRE-OPERATIVE DIAGNOSIS:  Osteoarthritis Right Knee  POST-OPERATIVE DIAGNOSIS:  Osteoarthritis Right Knee  PROCEDURE:  Procedure(s): RIGHT TOTAL KNEE ARTHROPLASTY  SURGEON: Aldean Baker  ASSISTANT: April Green  OPERATIVE IMPLANTS: Depuy , Posterior Stabilized.  Femur size 7, Tibia size 7, Patella size 38 3-peg oval button, with a 7 mm polyethylene insert.   PREOPERATIVE INDICATIONS:   [name] Carroll is a 61 y.o. year old male with end stage degenerative arthritis of the knee who failed conservative treatment and elected for Total Knee Arthroplasty.   The risks, benefits, and alternatives were discussed at length including but not limited to the risks of infection, bleeding, nerve injury, stiffness, blood clots, the need for revision surgery, cardiopulmonary complications, among others, and they were willing to proceed.  OPERATIVE DESCRIPTION:  The patient was brought to the operative room and placed in a supine position.  General anesthesia was administered.  IV antibiotics were given.  The lower extremity was prepped and draped in the usual sterile fashion.  [name] Carroll was used to cover all exposed skin. Time out was performed.    Anterior quadriceps tendon splitting approach was performed.  The patella was everted and osteophytes were removed.  The anterior horn of the medial and lateral meniscus was removed.   The distal femur was opened with the drill and the intramedullary distal femoral cutting jig was utilized, set at 5 degrees valgus resecting 9 mm off the distal femur.  Care was taken to protect the collateral ligaments.  Then the extramedullary tibial cutting jig was utilized set for 3 degree posterior slope.  Care was taken during the cut to protect the medial and collateral ligaments.  The proximal tibia was removed along with the posterior horns of the menisci.  The PCL was  sacrificed.    The extensor gap was measured and was approximately 7 mm.    The distal femoral sizing jig was applied, taking care to avoid notching.  Then the 4-in-1 cutting jig was applied and the anterior and posterior femur was cut, along with the chamfer cuts.  All posterior osteophytes were removed.  The flexion gap was then measured and was symmetric with the extension gap.  The distal femoral preparation using the appropriate jig to prepare the box.  The patella was then measured, and cut with the saw.    The proximal tibia sized and prepared accordingly with the reamer and the punch, and then all components were trialed with the poly insert.  The knee was found to have stable balance and full motion.  The knee was irrigated with normal saline and the knee was soaked with TXA.  The above named components were then cemented into place and all excess cement was removed.  The final polyethylene component was in place during cementation.  The knee was kept in extension until the cement hardened.  The knee was then taken through a range of motion and the patella tracked well and the knee irrigated copiously and the parapatellar and subcutaneous tissue closed with vicryl, and skin closed with staples..  A sterile dressing was applied and patient  was taken to the PACU in stable  condition.  There were no complications.  Total tourniquet time was approximately 30 minutes.

## 2017-06-12 NOTE — H&P (Signed)
TOTAL KNEE ADMISSION H&P  Patient is being admitted for right total knee arthroplasty.  Subjective:  Chief Complaint:right knee pain.  HPI: [name] Carroll, 61 y.o. male, has a history of pain and functional disability in the right knee due to arthritis and has failed non-surgical conservative treatments for greater than 12 weeks to includeNSAID's and/or analgesics, corticosteriod injections and activity modification.  Onset of symptoms was gradual, starting 8 years ago with gradually worsening course since that time. The patient noted no past surgery on the right knee(s).  Patient currently rates pain in the right knee(s) at 8 out of 10 with activity. Patient has night pain, worsening of pain with activity and weight bearing, pain that interferes with activities of daily living, pain with passive range of motion, crepitus and joint swelling.  Patient has evidence of subchondral cysts, subchondral sclerosis, periarticular osteophytes, joint subluxation and joint space narrowing by imaging studies. This patient has had avascular necrosis of the knee. There is no active infection.  Patient Active Problem List   Diagnosis Date Noted  . Bilateral primary osteoarthritis of knee 04/11/2017  . Concussion 12/26/2016   Past Medical History:  Diagnosis Date  . Arthritis   . History of kidney stones   . Hypertension     Past Surgical History:  Procedure Laterality Date  . APPENDECTOMY    . CARDIAC CATHETERIZATION    . KNEE ARTHROSCOPY W/ MENISCAL REPAIR    . WRIST ARTHROSCOPY      Prescriptions Prior to Admission  Medication Sig Dispense Refill Last Dose  . aspirin 81 MG tablet Take 81 mg by mouth daily.    Past Month at Unknown time  . azelastine (ASTELIN) 0.1 % nasal spray Place 2 sprays into both nostrils at bedtime. Use in each nostril as directed   Past Week at Unknown time  . ezetimibe-simvastatin (VYTORIN) 10-40 MG tablet Take 1 tablet by mouth at bedtime.   06/11/2017 at Unknown time  .  famotidine (PEPCID) 20 MG tablet Take 20 mg by mouth at bedtime.    06/11/2017 at Unknown time  . fluticasone (FLONASE) 50 MCG/ACT nasal spray Place 2 sprays into both nostrils at bedtime.   06/11/2017 at Unknown time  . levocetirizine (XYZAL) 5 MG tablet Take 5 mg by mouth at bedtime.    06/11/2017 at Unknown time  . lisinopril (PRINIVIL,ZESTRIL) 10 MG tablet Take 10 mg by mouth at bedtime.    06/11/2017 at Unknown time  . Magnesium 500 MG CAPS Take 500 mg by mouth 2 (two) times daily.   06/11/2017 at Unknown time  . Misc Natural Products (OSTEO BI-FLEX/5-LOXIN ADVANCED PO) Take by mouth.   Past Month at Unknown time  . Multiple Vitamin (MULTIVITAMIN WITH MINERALS) TABS tablet Take 1 tablet by mouth daily.   Past Month at Unknown time  . naproxen sodium (ANAPROX) 220 MG tablet Take 220 mg by mouth 2 (two) times daily as needed (for pain.).   Past Month at Unknown time  . Omega-3 Fatty Acids ([employer] FISH OIL) 1200 MG CAPS Take 1,200 mg by mouth daily with breakfast.   Past Week at Unknown time  . phentermine (ADIPEX-P) 37.5 MG tablet Take 37.5 mg by mouth daily before breakfast.  0 05/29/2017  . spironolactone (ALDACTONE) 25 MG tablet Take 25 mg by mouth daily.   06/11/2017 at Unknown time  . verapamil (VERELAN PM) 240 MG 24 hr capsule Take 240 mg by mouth at bedtime.   06/11/2017 at Unknown time  . vitamin E  400 UNIT capsule Take 400 Units by mouth daily.   Past Month at Unknown time   Allergies  Allergen Reactions  . Sulfa Antibiotics Hives    Social History  Substance Use Topics  . Smoking status: Never Smoker  . Smokeless tobacco: Never Used  . Alcohol use Yes     Comment: occasionally    History reviewed. No pertinent family history.   Review of Systems  All other systems reviewed and are negative.   Objective:  Physical Exam  Vital signs in last 24 hours: Temp:  [98.7 F (37.1 C)] 98.7 F (37.1 C) (09/26 0547) Pulse Rate:  [67] 67 (09/26 0547) Resp:  [19] 19 (09/26 0547) BP:  (109)/(69) 109/69 (09/26 0547) SpO2:  [98 %] 98 % (09/26 0547)  Labs:   Estimated body mass index is 52 kg/m as calculated from the following:   Height as of 05/31/17:  (1.727 m).   Weight as of 05/31/17: 342 lb (155.1 kg).   Imaging Review Plain radiographs demonstrate moderate degenerative joint disease of the right knee(s). The overall alignment ismild varus. The bone quality appears to be adequate for age and reported activity level.  Assessment/Plan:  End stage arthritis, right knee   The patient history, physical examination, clinical judgment of the provider and imaging studies are consistent with end stage degenerative joint disease of the right knee(s) and total knee arthroplasty is deemed medically necessary. The treatment options including medical management, injection therapy arthroscopy and arthroplasty were discussed at length. The risks and benefits of total knee arthroplasty were presented and reviewed. The risks due to aseptic loosening, infection, stiffness, patella tracking problems, thromboembolic complications and other imponderables were discussed. The patient acknowledged the explanation, agreed to proceed with the plan and consent was signed. Patient is being admitted for inpatient treatment for surgery, pain control, PT, OT, prophylactic antibiotics, VTE prophylaxis, progressive ambulation and ADL's and discharge planning. The patient is planning to be discharged home with home health services

## 2017-06-12 NOTE — Anesthesia Postprocedure Evaluation (Signed)
Anesthesia Post Note  Patient: [name] Carroll  Procedure(s) Performed: Procedure(s) (LRB): RIGHT TOTAL KNEE ARTHROPLASTY (Right)     Patient location during evaluation: PACU Anesthesia Type: General Level of consciousness: awake and alert Pain management: pain level controlled Vital Signs Assessment: post-procedure vital signs reviewed and stable Respiratory status: spontaneous breathing, nonlabored ventilation, respiratory function stable and patient connected to nasal cannula oxygen Cardiovascular status: blood pressure returned to baseline and stable Postop Assessment: no apparent nausea or vomiting Anesthetic complications: no    Last Vitals:  Vitals:   06/12/17 1117 06/12/17 1300  BP: 130/76 126/80  Pulse:  86  Resp:    Temp: 36.5 C (!) 36.1 C  SpO2:                   [name]

## 2017-06-12 NOTE — Evaluation (Signed)
Physical Therapy Evaluation Patient Details Name: [name] Carroll MRN: [id number] DOB: 1956-08-22 Today's Date: 06/12/2017   History of Present Illness  Pt is a 61 y/o male s/p elective R TKA. PMH includes HTN, concussion, and s/p cardiac cath.   Clinical Impression  Pt s/p surgery above with deficits below. PTA, pt was using cane for ambulation secondary to pain. Upon eval, pt very limited by post op pain and weakness. Limited gait distance this session secondary to pain. Required min A for mobility. Will have necessary assist from wife at home and will need DME below. Follow up recommendations per MD arrangements. Will continue to follow acutely to maximize functional mobility independence and safety.     Follow Up Recommendations DC plan and follow up therapy as arranged by surgeon;Supervision for mobility/OOB    Equipment Recommendations  Rolling walker with 5" wheels;3in1 (PT) (bariatric )    Recommendations for Other Services       Precautions / Restrictions Precautions Precautions: Knee Precaution Booklet Issued: Yes (comment) Precaution Comments: Reviewed supine ther ex. Tolerance limited secondary to pain.  Restrictions Weight Bearing Restrictions: Yes RLE Weight Bearing: Weight bearing as tolerated      Mobility  Bed Mobility Overal bed mobility: Needs Assistance Bed Mobility: Supine to Sit     Supine to sit: HOB elevated;Min assist     General bed mobility comments: Min A for RLE management. Heavy use of bed rails and elevated HOB.   Transfers Overall transfer level: Needs assistance Equipment used: Rolling walker (2 wheeled) Transfers: Sit to/from Stand Sit to Stand: Min assist         General transfer comment: Min A for steadying assist. Verbal cues for safe hand placement.   Ambulation/Gait Ambulation/Gait assistance: Min assist Ambulation Distance (Feet): 5 Feet Assistive device: Rolling walker (2 wheeled) Gait Pattern/deviations: Step-to  pattern;Decreased step length - right;Decreased step length - left;Decreased weight shift to right;Antalgic Gait velocity: Decreased  Gait velocity interpretation: Below normal speed for age/gender General Gait Details: Slow, antalgic gait. Min A for steadying assist. Verbal cues for sequencing with RW.   Stairs            Wheelchair Mobility    Modified Rankin (Stroke Patients Only)       Balance Overall balance assessment: Needs assistance Sitting-balance support: No upper extremity supported;Feet supported Sitting balance-Leahy Scale: Good     Standing balance support: Bilateral upper extremity supported;During functional activity Standing balance-Leahy Scale: Poor Standing balance comment: Reliant on RW for support                              Pertinent Vitals/Pain Pain Assessment: 0-10 Pain Score: 9  Pain Location: R knee  Pain Descriptors / Indicators: Aching;Grimacing;Operative site guarding Pain Intervention(s): Limited activity within patient's tolerance;Monitored during session;Repositioned    Home Living Family/patient expects to be discharged to:: Private residence Living Arrangements: Spouse/significant other Available Help at Discharge: Family;Available 24 hours/day Type of Home: House Home Access: Stairs to enter Entrance Stairs-Rails: None Entrance Stairs-Number of Steps: 2 Home Layout: One level Home Equipment: Cane - single point      Prior Function Level of Independence: Independent with assistive device(s)         Comments: Used cane at baseline      Hand Dominance   Dominant Hand: Right    Extremity/Trunk Assessment   Upper Extremity Assessment Upper Extremity Assessment: Defer to OT evaluation    Lower Extremity  Assessment Lower Extremity Assessment: RLE deficits/detail RLE Deficits / Details: Sensory in tact. Deficits consistent with post op pain and weakness. Able to perform exercises below.     Cervical /  Trunk Assessment Cervical / Trunk Assessment: Normal  Communication   Communication: Prefers language other than English  Cognition Arousal/Alertness: Awake/alert Behavior During Therapy: WFL for tasks assessed/performed Overall Cognitive Status: Within Functional Limits for tasks assessed                                        General Comments General comments (skin integrity, edema, etc.): Pt's wife present during session     Exercises Total Joint Exercises Ankle Circles/Pumps: AROM;Both;20 reps Quad Sets: AROM;Right;10 reps Towel Squeeze: AROM;Both;10 reps Heel Slides: AAROM;Right;10 reps   Assessment/Plan    PT Assessment Patient needs continued PT services  PT Problem List Decreased strength;Decreased range of motion;Decreased activity tolerance;Decreased balance;Decreased mobility;Decreased knowledge of precautions;Pain       PT Treatment Interventions DME instruction;Gait training;Stair training;Functional mobility training;Therapeutic activities;Balance training;Neuromuscular re-education;Therapeutic exercise;Patient/family education    PT Goals (Current goals can be found in the Care Plan section)  Acute Rehab PT Goals Patient Stated Goal: to decrease pain  PT Goal Formulation: With patient Time For Goal Achievement: 06/19/17 Potential to Achieve Goals: Good    Frequency 7X/week   Barriers to discharge        Co-evaluation               AM-PAC PT "6 Clicks" Daily Activity  Outcome Measure Difficulty turning over in bed (including adjusting bedclothes, sheets and blankets)?: A Little Difficulty moving from lying on back to sitting on the side of the bed? : Unable Difficulty sitting down on and standing up from a chair with arms (e.g., wheelchair, bedside commode, etc,.)?: Unable Help needed moving to and from a bed to chair (including a wheelchair)?: A Little Help needed walking in hospital room?: A Little Help needed climbing 3-5 steps  with a railing? : A Lot 6 Click Score: 13    End of Session Equipment Utilized During Treatment: Gait belt Activity Tolerance: Patient limited by pain Patient left: in chair;with call bell/phone within reach;with family/visitor present Nurse Communication: Mobility status PT Visit Diagnosis: Other abnormalities of gait and mobility (R26.89);Pain Pain - Right/Left: Right Pain - part of body: Knee    Time: 8295-6213 PT Time Calculation (min) (ACUTE ONLY): 32 min   Charges:   PT Evaluation $PT Eval Low Complexity: 1 Low PT Treatments $Gait Training: 8-22 mins   PT G Codes:        Gladys Damme, PT, DPT  Acute Rehabilitation Services  Pager: [phone]   Lehman Prom 06/12/2017, 3:21 PM

## 2017-06-12 NOTE — Therapy (Signed)
Occupational Therapy Evaluation Patient Details Name: [name] Carroll MRN: [id number] DOB: 23-Oct-1955 Today's Date: 06/12/2017    History of Present Illness Pt is a 61 y/o male s/p elective R TKA. PMH includes HTN, concussion, and s/p cardiac cath.    Clinical Impression   Pt reports using a cane for functional mobility and a sock aide to don socks PTA. Currently, pt requires min assist for LB ADLs and functional mobility and supervision/set up for all other ADLs. Pt reports family is available to provide 24 hour assistance upon d/c. OT will follow acutely to address established goals.     Follow Up Recommendations  DC plan and follow up therapy as arranged by surgeon;Supervision/Assistance - 24 hour    Equipment Recommendations  3 in 1 bedside commode (bariatric)    Recommendations for Other Services       Precautions / Restrictions Precautions Precautions: Knee Precaution Booklet Issued: Yes (comment) Precaution Comments: Reviewed weight bearing precautions Restrictions Weight Bearing Restrictions: Yes RLE Weight Bearing: Weight bearing as tolerated      Mobility Bed Mobility Overal bed mobility: Needs Assistance Bed Mobility: Supine to Sit     Supine to sit: HOB elevated;Min assist     General bed mobility comments: Pt sitting in chair upon OT arrival  Transfers Overall transfer level: Needs assistance Equipment used: Rolling walker (2 wheeled) Transfers: Sit to/from Stand Sit to Stand: Min assist         General transfer comment: Min A for steadying assist. Verbal cues for safe hand placement.     Balance Overall balance assessment: Needs assistance Sitting-balance support: No upper extremity supported;Feet supported Sitting balance-Leahy Scale: Good     Standing balance support: Bilateral upper extremity supported;During functional activity;No upper extremity supported Standing balance-Leahy Scale: Fair Standing balance comment: Pt able to stand with no  UE support with no LOB. Reliant on RW during functional mobility.                            ADL either performed or assessed with clinical judgement   ADL Overall ADL's : Needs assistance/impaired     Grooming: Supervision/safety;Set up;Sitting   Upper Body Bathing: Supervision/ safety;Set up;Sitting   Lower Body Bathing: Minimal assistance;Sit to/from stand   Upper Body Dressing : Supervision/safety;Set up;Sitting   Lower Body Dressing: Minimal assistance;Sit to/from stand   Toilet Transfer: Minimal assistance;Ambulation;RW       Tub/ Shower Transfer: Minimal assistance;Ambulation;3 in 1   Functional mobility during ADLs: Minimal assistance;Rolling walker       Vision         Perception     Praxis      Pertinent Vitals/Pain Pain Assessment: Faces Pain Score: 9  Faces Pain Scale: Hurts whole lot (Pain with sit to stand transfer) Pain Location: R knee  Pain Descriptors / Indicators: Aching;Grimacing;Operative site guarding Pain Intervention(s): Limited activity within patient's tolerance;Monitored during session     Hand Dominance Right   Extremity/Trunk Assessment Upper Extremity Assessment Upper Extremity Assessment: Overall WFL for tasks assessed   Lower Extremity Assessment Lower Extremity Assessment: Defer to PT evaluation RLE Deficits / Details: Sensory in tact. Deficits consistent with post op pain and weakness. Able to perform exercises below.    Cervical / Trunk Assessment Cervical / Trunk Assessment: Normal   Communication Communication Communication: No difficulties   Cognition Arousal/Alertness: Awake/alert Behavior During Therapy: WFL for tasks assessed/performed Overall Cognitive Status: Within Functional Limits for tasks assessed  General Comments  Pt's wife and daughter present during evaluation.Pt works full time from home and wants to get back to cooking for his family and  taking care of the house.     Exercises    Shoulder Instructions      Home Living Family/patient expects to be discharged to:: Private residence Living Arrangements: Spouse/significant other Available Help at Discharge: Family;Available 24 hours/day Type of Home: House Home Access: Stairs to enter [employer] of Steps: 2 Entrance Stairs-Rails: None Home Layout: One level     Bathroom Shower/Tub: Chief Strategy Officer: Standard Bathroom Accessibility: Yes How Accessible: Accessible via walker Home Equipment: Cane - single point;Adaptive equipment;Hand held shower head Adaptive Equipment: Reacher;Sock aid;Long-handled shoe horn        Prior Functioning/Environment Level of Independence: Independent with assistive device(s)        Comments: Used cane at baseline         OT Problem List: Decreased knowledge of use of DME or AE;Pain;Decreased strength;Decreased activity tolerance;Decreased range of motion      OT Treatment/Interventions: Self-care/ADL training;Therapeutic exercise;Energy conservation;DME and/or AE instruction;Therapeutic activities;Cognitive remediation/compensation;Balance training;Patient/family education    OT Goals(Current goals can be found in the care plan section) Acute Rehab OT Goals Patient Stated Goal: To feel better and get back to cooking and taking care of the house.  OT Goal Formulation: With patient Time For Goal Achievement: 06/26/17 Potential to Achieve Goals: Good ADL Goals Pt Will Perform Lower Body Bathing: with modified independence;sit to/from stand (AE as needed) Pt Will Perform Lower Body Dressing: with modified independence;sit to/from stand (AE as needed) Pt Will Perform Tub/Shower Transfer: with modified independence;ambulating;3 in 1  OT Frequency: Min 2X/week   Barriers to D/C:            Co-evaluation              AM-PAC PT "6 Clicks" Daily Activity     Outcome Measure Help from  another person eating meals?: None Help from another person taking care of personal grooming?: None Help from another person toileting, which includes using toliet, bedpan, or urinal?: None Help from another person bathing (including washing, rinsing, drying)?: A Little Help from another person to put on and taking off regular upper body clothing?: None Help from another person to put on and taking off regular lower body clothing?: A Little 6 Click Score: 22   End of Session Equipment Utilized During Treatment: Gait belt;Rolling walker Nurse Communication: Mobility status  Activity Tolerance: Patient tolerated treatment well Patient left: in chair;with call bell/phone within reach;with family/visitor present  OT Visit Diagnosis: Unsteadiness on feet (R26.81);Pain Pain - Right/Left: Right Pain - part of body: Knee                Time: 4540-9811 OT Time Calculation (min): 24 min Charges:  OT General Charges $OT Visit: 1 Visit OT Evaluation $OT Eval Moderate Complexity: 1 Mod OT Treatments $Self Care/Home Management : 8-22 mins G-Codes:     Cammy Copa, OTS [phone]  Cammy Copa 06/12/2017, 4:27 PM

## 2017-06-12 NOTE — Anesthesia Procedure Notes (Signed)
Procedure Name: Intubation Date/Time: 06/12/2017 8:44 AM Performed by: Rejeana Brock L Pre-anesthesia Checklist: Patient identified, Emergency Drugs available, Suction available and Patient being monitored Patient Re-evaluated:Patient Re-evaluated prior to induction Oxygen Delivery Method: Circle System Utilized Preoxygenation: Pre-oxygenation with 100% oxygen Induction Type: IV induction Ventilation: Mask ventilation without difficulty and Oral airway inserted - appropriate to patient size Laryngoscope Size: Mac and 4 Grade View: Grade I Tube type: Oral Tube size: 7.5 mm Number of attempts: 1 Airway Equipment and Method: Stylet and Oral airway Placement Confirmation: ETT inserted through vocal cords under direct vision,  positive ETCO2 and breath sounds checked- equal and bilateral Secured at: 22 cm Tube secured with: Tape Dental Injury: Teeth and Oropharynx as per pre-operative assessment

## 2017-06-12 NOTE — Transfer of Care (Signed)
Immediate Anesthesia Transfer of Care Note  Patient: [name] Carroll  Procedure(s) Performed: Procedure(s): RIGHT TOTAL KNEE ARTHROPLASTY (Right)  Patient Location: PACU  Anesthesia Type:GA combined with regional for post-op pain  Level of Consciousness: awake and alert   Airway & Oxygen Therapy: Patient Spontanous Breathing and Patient connected to nasal cannula oxygen  Post-op Assessment: Report given to RN and Post -op Vital signs reviewed and stable  Post vital signs: Reviewed and stable  Last Vitals:  Vitals:   06/12/17 0547  BP: 109/69  Pulse: 67  Resp: 19  Temp: 37.1 C  SpO2: 98%    Last Pain:  Vitals:   06/12/17 0611  TempSrc:   PainSc: 4          Complications: No apparent anesthesia complications

## 2017-06-12 NOTE — Anesthesia Procedure Notes (Signed)
Anesthesia Regional Block: Adductor canal block   Pre-Anesthetic Checklist: ,, timeout performed, Correct Patient, Correct Site, Correct Laterality, Correct Procedure, Correct Position, site marked, Risks and benefits discussed,  Surgical consent,  Pre-op evaluation,  At surgeon's request and post-op pain management  Laterality: Right  Prep: chloraprep       Needles:  Injection technique: Single-shot  Needle Type: Echogenic Needle     Needle Length: 9cm  Needle Gauge: 21     Additional Needles:   Procedures:,,,, ultrasound used (permanent image in chart),,,,  Narrative:  Start time: 06/12/2017 7:15 AM End time: 06/12/2017 7:25 AM Injection made incrementally with aspirations every 5 mL.  Performed by: Personally  Anesthesiologist: Shona Simpson D  Additional Notes: Tolerated well. No issues.

## 2017-06-13 ENCOUNTER — Encounter (HOSPITAL_COMMUNITY): Payer: Self-pay | Admitting: Orthopedic Surgery

## 2017-06-13 MED ORDER — ASPIRIN EC 325 MG PO TBEC: 325.0000 mg | DELAYED_RELEASE_TABLET | Freq: Every day | ORAL | 0 refills | Status: DC

## 2017-06-13 MED ORDER — OXYCODONE-ACETAMINOPHEN 5-325 MG PO TABS: 1.0000 | ORAL_TABLET | ORAL | 0 refills | Status: DC | PRN

## 2017-06-13 NOTE — Progress Notes (Signed)
Patient ID: [name] Carroll, male   DOB: March 28, 1956, 61 y.o.   MRN: [id number] Patient was out of bed yesterday. Patient is postoperative day 1 right total knee arthroplasty. He is out of bed to a chair this morning. Plan for discharge to home tomorrow on Friday. Prescriptions on the chart for Percocet for pain we will continue aspirin a day for DVT prophylaxis.

## 2017-06-13 NOTE — Plan of Care (Signed)
Problem: Pain Managment: Goal: General experience of comfort will improve Patient voices understanding of pain scale and

## 2017-06-13 NOTE — Progress Notes (Addendum)
Physical Therapy Treatment Patient Details Name: [name] Carroll MRN: [id number] DOB: 1956/05/26 Today's Date: 06/13/2017    History of Present Illness Pt is a 61 y/o male s/p elective R TKA. PMH includes HTN, concussion, and s/p cardiac cath.     PT Comments    Pt limited due to pain but able to progress gait and review supine exercises. Will f/u in pm to progress mobility.    Follow Up Recommendations  DC plan and follow up therapy as arranged by surgeon;Supervision for mobility/OOB     Equipment Recommendations  Rolling walker with 5" wheels;3in1 (PT) (bariatric)    Recommendations for Other Services       Precautions / Restrictions Precautions Precautions: Knee Precaution Booklet Issued: Yes (comment) Precaution Comments: Reviewed weight bearing restrictions Restrictions Weight Bearing Restrictions: Yes RLE Weight Bearing: Weight bearing as tolerated    Mobility  Bed Mobility Overal bed mobility: Needs Assistance Bed Mobility: Sit to Supine     Supine to sit: HOB elevated;Min assist Sit to supine: Min assist   General bed mobility comments: Min assist with LE   Transfers Overall transfer level: Needs assistance Equipment used: Rolling walker (2 wheeled) Transfers: Sit to/from Stand Sit to Stand: Min assist         General transfer comment: Min A for steadying assist. Verbal cues for safe hand placement.   Ambulation/Gait Ambulation/Gait assistance: Min assist Ambulation Distance (Feet): 250 Feet Assistive device: Rolling walker (2 wheeled) Gait Pattern/deviations: Step-to pattern;Decreased step length - right;Decreased step length - left;Decreased weight shift to right;Antalgic;Step-through pattern Gait velocity: Decreased  Gait velocity interpretation: Below normal speed for age/gender General Gait Details: Slow, antalgic gait. Min A for steadying assist. Verbal cues for sequencing with RW.    Stairs            Wheelchair Mobility    Modified  Rankin (Stroke Patients Only)       Balance Overall balance assessment: Needs assistance Sitting-balance support: No upper extremity supported;Feet supported Sitting balance-Leahy Scale: Good     Standing balance support: Bilateral upper extremity supported;During functional activity;No upper extremity supported Standing balance-Leahy Scale: Fair Standing balance comment: Pt able to stand with no UE support with no LOB. Reliant on RW during functional mobility.                             Cognition Arousal/Alertness: Awake/alert Behavior During Therapy: WFL for tasks assessed/performed Overall Cognitive Status: Within Functional Limits for tasks assessed                                        Exercises Total Joint Exercises Ankle Circles/Pumps: AROM;Both;20 reps Quad Sets: AROM;Right;10 reps Towel Squeeze: AROM;Both;10 reps Short Arc Quad: AROM;Right;10 reps;Supine Heel Slides: AAROM;Right;10 reps;Supine Hip ABduction/ADduction: AROM;Right;10 reps;Supine Straight Leg Raises: AROM;Right;10 reps;Supine Long Arc Quad: AROM;Right;10 reps;Seated Goniometric ROM: 75 degree R knee (grossly)    General Comments General comments (skin integrity, edema, etc.): Pt's wife present for education tub shower transfer technique       Pertinent Vitals/Pain Pain Assessment: 0-10 Pain Score: 8  Faces Pain Scale: Hurts even more (Sit to stand transfer) Pain Location: R knee  Pain Descriptors / Indicators: Aching;Grimacing;Operative site guarding Pain Intervention(s): Monitored during session;Repositioned;Ice applied    Home Living  Prior Function            PT Goals (current goals can now be found in the care plan section) Acute Rehab PT Goals Patient Stated Goal: To feel better and get back to cooking and taking care of the house.  Potential to Achieve Goals: Good Progress towards PT goals: Progressing toward goals     Frequency    7X/week      PT Plan Current plan remains appropriate    Co-evaluation              AM-PAC PT "6 Clicks" Daily Activity  Outcome Measure  Difficulty turning over in bed (including adjusting bedclothes, sheets and blankets)?: A Little Difficulty moving from lying on back to sitting on the side of the bed? : Unable Difficulty sitting down on and standing up from a chair with arms (e.g., wheelchair, bedside commode, etc,.)?: Unable Help needed moving to and from a bed to chair (including a wheelchair)?: A Little Help needed walking in hospital room?: A Little Help needed climbing 3-5 steps with a railing? : A Lot 6 Click Score: 13    End of Session Equipment Utilized During Treatment: Gait belt Activity Tolerance: Patient limited by pain Patient left: in chair;with call bell/phone within reach;with family/visitor present Nurse Communication: Mobility status PT Visit Diagnosis: Other abnormalities of gait and mobility (R26.89);Pain Pain - Right/Left: Right Pain - part of body: Knee     Time: 1001-1034 PT Time Calculation (min) (ACUTE ONLY): 33 min  Charges:  $Gait Training: 8-22 mins $Therapeutic Exercise: 8-22 mins                    G Codes:       Joycelyn Rua, PTA pager [phone]    KOURTNEY MONTESINOS 06/13/2017, 2:50 PM

## 2017-06-13 NOTE — Progress Notes (Signed)
Physical Therapy Treatment Patient Details Name: [name] Carroll MRN: [id number] DOB: [birth year] Today's Date: 06/13/2017    History of Present Illness Pt is a 61 y/o male s/p elective R TKA. PMH includes HTN, concussion, and s/p cardiac cath.     PT Comments    Pt in decreased pain during second session this evening.  Plan for stair training in am.  Pt remains appropriate to return home with support from his wife.     Follow Up Recommendations  DC plan and follow up therapy as arranged by surgeon;Supervision for mobility/OOB     Equipment Recommendations  Rolling walker with 5" wheels;3in1 (PT) (bariatric)    Recommendations for Other Services       Precautions / Restrictions Precautions Precautions: Knee Precaution Booklet Issued: Yes (comment) Precaution Comments: Reviewed knee precautions with patient to avoid placing objects under the knee and to rest in supported extension to encourage knee extension Restrictions Weight Bearing Restrictions: Yes RLE Weight Bearing: Weight bearing as tolerated    Mobility  Bed Mobility Overal bed mobility: Needs Assistance Bed Mobility: Supine to Sit     Supine to sit: Min assist Sit to supine: Min assist   General bed mobility comments: min assist to advance RLE to edge of bed.  HOB elevated with heavy reliance on railings.    Transfers Overall transfer level: Needs assistance Equipment used: Rolling walker (2 wheeled) Transfers: Sit to/from Stand Sit to Stand: Min guard         General transfer comment: Cues for hand placement to and from seated surface.  Cues to stress the importance of eccentric loading.  Pt required cues to advance RLE forward to decreased pain and improve ease of transfer.    Ambulation/Gait Ambulation/Gait assistance: Min assist Ambulation Distance (Feet): 250 Feet Assistive device: Rolling walker (2 wheeled) Gait Pattern/deviations: Decreased weight shift to right;Antalgic;Step-through pattern Gait  velocity: Decreased  Gait velocity interpretation: Below normal speed for age/gender General Gait Details: Pt with consistent progression to step through pattern.  Pt tolerated 2nd trial with improved gait speed   Stairs            Wheelchair Mobility    Modified Rankin (Stroke Patients Only)       Balance Overall balance assessment: Needs assistance   Sitting balance-Leahy Scale: Good       Standing balance-Leahy Scale: Fair Standing balance comment: Pt able to stand with no UE support with no LOB. Reliant on RW during functional mobility.                             Cognition Arousal/Alertness: Awake/alert Behavior During Therapy: WFL for tasks assessed/performed Overall Cognitive Status: Within Functional Limits for tasks assessed                                        Exercises Total Joint Exercises Ankle Circles/Pumps: AROM;Both;20 reps Quad Sets: AROM;Right;10 reps Towel Squeeze: AROM;Both;10 reps Short Arc Quad: AROM;Right;10 reps;Supine Heel Slides: AAROM;Right;10 reps;Supine Hip ABduction/ADduction: AROM;Right;10 reps;Supine Straight Leg Raises: AROM;Right;10 reps;Supine Long Arc Quad: AROM;Right;10 reps;Seated Goniometric ROM: 75 degree R knee (grossly)    General Comments        Pertinent Vitals/Pain Pain Assessment: 0-10 Pain Score: 4  Pain Location: R knee  Pain Descriptors / Indicators: Aching;Grimacing;Operative site guarding Pain Intervention(s): Monitored during session;Repositioned;Ice applied;Patient requesting pain meds-RN  notified;RN gave pain meds during session    Home Living                      Prior Function            PT Goals (current goals can now be found in the care plan section) Acute Rehab PT Goals Patient Stated Goal: To feel better and get back to cooking and taking care of the house.  Potential to Achieve Goals: Good Progress towards PT goals: Progressing toward goals     Frequency    7X/week      PT Plan Current plan remains appropriate    Co-evaluation              AM-PAC PT "6 Clicks" Daily Activity  Outcome Measure  Difficulty turning over in bed (including adjusting bedclothes, sheets and blankets)?: A Little Difficulty moving from lying on back to sitting on the side of the bed? : Unable Difficulty sitting down on and standing up from a chair with arms (e.g., wheelchair, bedside commode, etc,.)?: A Lot Help needed moving to and from a bed to chair (including a wheelchair)?: A Little Help needed walking in hospital room?: A Little Help needed climbing 3-5 steps with a railing? : A Lot 6 Click Score: 14    End of Session Equipment Utilized During Treatment: Gait belt Activity Tolerance: Patient limited by pain Patient left: in chair;with call bell/phone within reach;with family/visitor present Nurse Communication: Mobility status PT Visit Diagnosis: Other abnormalities of gait and mobility (R26.89);Pain Pain - Right/Left: Right Pain - part of body: Knee     Time: 1710-1733 PT Time Calculation (min) (ACUTE ONLY): 23 min  Charges:  $Gait Training: 8-22 mins $Therapeutic Exercise: 8-22 mins                    G Codes:       [name] Carroll, PTA pager [phone]    [name] Carroll 06/13/2017, 6:39 PM

## 2017-06-13 NOTE — Discharge Summary (Addendum)
Discharge Diagnoses:  Active Problems:   Unilateral primary osteoarthritis, right knee   Total knee replacement status, right   Surgeries: Procedure(s): RIGHT TOTAL KNEE ARTHROPLASTY on 06/12/2017    Consultants:   Discharged Condition: Improved  Hospital Course: [name] Carroll is an 61 y.o. male who was admitted 06/12/2017 with a chief complaint of osteoarthritis right knee, with a final diagnosis of Osteoarthritis Right Knee.  Patient was brought to the operating room on 06/12/2017 and underwent Procedure(s): RIGHT TOTAL KNEE ARTHROPLASTY.    Patient was given perioperative antibiotics: Anti-infectives    Start     Dose/Rate Route Frequency Ordered Stop   06/12/17 1500  ceFAZolin (ANCEF) IVPB 2g/100 mL premix     2 g 200 mL/hr over 30 Minutes Intravenous Every 6 hours 06/12/17 1150 06/12/17 2359   06/12/17 0700  ceFAZolin (ANCEF) 3 g in dextrose 5 % 50 mL IVPB     3 g 130 mL/hr over 30 Minutes Intravenous To Surgery 06/11/17 1336 06/12/17 0845    .  Patient was given sequential compression devices, early ambulation, and aspirin for DVT prophylaxis.  Recent vital signs: Patient Vitals for the past 24 hrs:  BP Temp Temp src Pulse Resp SpO2  06/13/17 0633 115/76 98.7 F (37.1 C) Oral 97 17 95 %  06/12/17 2243 99/64 98 F (36.7 C) Oral (!) 111 18 95 %  06/12/17 1300 126/80 (!) 97 F (36.1 C) Oral 86 - -  06/12/17 1117 130/76 97.7 F (36.5 C) - - - -  06/12/17 1103 (!) 144/78 - - 80 (!) 9 96 %  06/12/17 1048 120/73 - - 80 14 100 %  06/12/17 1033 102/66 - - 84 (!) 22 100 %  06/12/17 1018 (!) 103/58 - - 89 16 93 %  06/12/17 1017 - 97.7 F (36.5 C) - - - -  .  Recent laboratory studies: No results found.  Discharge Medications:   Allergies as of 06/13/2017      Reactions   Sulfa Antibiotics Hives      Medication List    STOP taking these medications   aspirin 81 MG tablet Replaced by:  aspirin EC 325 MG tablet     TAKE these medications   aspirin EC 325 MG  tablet Take 1 tablet (325 mg total) by mouth daily. Replaces:  aspirin 81 MG tablet   azelastine 0.1 % nasal spray Commonly known as:  ASTELIN Place 2 sprays into both nostrils at bedtime. Use in each nostril as directed   [employer] FISH OIL 1200 MG Caps Take 1,200 mg by mouth daily with breakfast.   ezetimibe-simvastatin 10-40 MG tablet Commonly known as:  VYTORIN Take 1 tablet by mouth at bedtime.   famotidine 20 MG tablet Commonly known as:  PEPCID Take 20 mg by mouth at bedtime.   fluticasone 50 MCG/ACT nasal spray Commonly known as:  FLONASE Place 2 sprays into both nostrils at bedtime.   levocetirizine 5 MG tablet Commonly known as:  XYZAL Take 5 mg by mouth at bedtime.   lisinopril 10 MG tablet Commonly known as:  PRINIVIL,ZESTRIL Take 10 mg by mouth at bedtime.   Magnesium 500 MG Caps Take 500 mg by mouth 2 (two) times daily.   multivitamin with minerals Tabs tablet Take 1 tablet by mouth daily.   naproxen sodium 220 MG tablet Commonly known as:  ANAPROX Take 220 mg by mouth 2 (two) times daily as needed (for pain.).   OSTEO BI-FLEX/5-LOXIN ADVANCED PO Take by mouth.  oxyCODONE-acetaminophen 5-325 MG tablet Commonly known as:  ROXICET Take 1 tablet by mouth every 4 (four) hours as needed for severe pain.   phentermine 37.5 MG tablet Commonly known as:  ADIPEX-P Take 37.5 mg by mouth daily before breakfast.   spironolactone 25 MG tablet Commonly known as:  ALDACTONE Take 25 mg by mouth daily.   verapamil 240 MG 24 hr capsule Commonly known as:  VERELAN PM Take 240 mg by mouth at bedtime.   vitamin E 400 UNIT capsule Take 400 Units by mouth daily.            Discharge Care Instructions        Start     Ordered   06/13/17 0000  Call MD / Call 911    Comments:  If you experience chest pain or shortness of breath, CALL 911 and be transported to the hospital emergency room.  If you develope a fever above 101 F, pus (white drainage) or increased  drainage or redness at the wound, or calf pain, call your surgeon's office.   06/13/17 0643   06/13/17 0000  Diet - low sodium heart healthy     06/13/17 0643   06/13/17 0000  Constipation Prevention    Comments:  Drink plenty of fluids.  Prune juice may be helpful.  You may use a stool softener, such as Colace (over the counter) 100 mg twice a day.  Use MiraLax (over the counter) for constipation as needed.   06/13/17 0643   06/13/17 0000  Increase activity slowly as tolerated     06/13/17 0643   06/13/17 0000  oxyCODONE-acetaminophen (ROXICET) 5-325 MG tablet  Every 4 hours PRN     06/13/17 0643   06/13/17 0000  Elevate operative extremity     06/13/17 0643   06/13/17 0000  Weight bearing as tolerated    Question Answer Comment  Laterality right   Extremity Lower      06/13/17 0643   06/13/17 0000  aspirin EC 325 MG tablet  Daily     06/13/17 0643      Diagnostic Studies: No results found.  Patient benefited maximally from their hospital stay and there were no complications.     Disposition: Final discharge disposition not confirmed Discharge Instructions    Call MD / Call 911    Complete by:  As directed    If you experience chest pain or shortness of breath, CALL 911 and be transported to the hospital emergency room.  If you develope a fever above 101 F, pus (white drainage) or increased drainage or redness at the wound, or calf pain, call your surgeon's office.   Constipation Prevention    Complete by:  As directed    Drink plenty of fluids.  Prune juice may be helpful.  You may use a stool softener, such as Colace (over the counter) 100 mg twice a day.  Use MiraLax (over the counter) for constipation as needed.   Diet - low sodium heart healthy    Complete by:  As directed    Elevate operative extremity    Complete by:  As directed    Increase activity slowly as tolerated    Complete by:  As directed    Weight bearing as tolerated    Complete by:  As directed     Laterality:  right   Extremity:  Lower     Follow-up Information    [name] Mustard, MD Follow up in 1 week(s).  Specialty:  Orthopedic Surgery Contact information: [address] [place] [address] [phone]            Signed: Nadara Carroll 06/13/2017, 6:43 AM

## 2017-06-13 NOTE — Therapy (Signed)
Occupational Therapy Treatment Patient Details Name: [name] Carroll MRN: [id number] DOB: [birth year] Today's Date: 06/13/2017    History of present illness Pt is a 61 y/o male s/p elective R TKA. PMH includes HTN, concussion, and s/p cardiac cath.    OT comments  Focus of today's session on tub shower transfer and AE for LB ADLs. Pt provided education and pt was able to complete tub shower transfer technique using a tub bench with min guard and verbal cues. OT will continue to follow to address established goals.       Follow Up Recommendations  DC plan and follow up therapy as arranged by surgeon;Supervision/Assistance - 24 hour    Equipment Recommendations  3 in 1 bedside commode    Recommendations for Other Services      Precautions / Restrictions Precautions Precautions: Knee Precaution Booklet Issued: Yes (comment) Restrictions Weight Bearing Restrictions: Yes RLE Weight Bearing: Weight bearing as tolerated       Mobility Bed Mobility Overal bed mobility: Needs Assistance Bed Mobility: Supine to Sit     Supine to sit: HOB elevated;Min assist     General bed mobility comments: Min assist with LE   Transfers Overall transfer level: Needs assistance Equipment used: Rolling walker (2 wheeled) Transfers: Sit to/from Stand Sit to Stand: Min assist         General transfer comment: Min A for steadying assist. Verbal cues for safe hand placement.     Balance Overall balance assessment: Needs assistance Sitting-balance support: No upper extremity supported;Feet supported Sitting balance-Leahy Scale: Good     Standing balance support: Bilateral upper extremity supported;During functional activity;No upper extremity supported Standing balance-Leahy Scale: Fair                             ADL either performed or assessed with clinical judgement   ADL Overall ADL's : Needs assistance/impaired               Lower Body Bathing Details (indicate  cue type and reason): Pt educated on use of long handle sponge for LB bathing       Lower Body Dressing Details (indicate cue type and reason): Pt educated on use of reacher and sock aide for LB dressing.          Tub/ Shower Transfer: Min guard;Ambulation;Tub bench;Rolling walker Tub/Shower Transfer Details (indicate cue type and reason): Pt completed tub shower transfer with a tub bench as home shower will not fit a 3 in 1.  Functional mobility during ADLs: Minimal assistance;Rolling walker       Vision       Perception     Praxis      Cognition Arousal/Alertness: Awake/alert Behavior During Therapy: WFL for tasks assessed/performed Overall Cognitive Status: Within Functional Limits for tasks assessed                                          Exercises     Shoulder Instructions       General Comments Pt's wife present for education tub shower transfer technique     Pertinent Vitals/ Pain       Pain Assessment: Faces Faces Pain Scale: Hurts even more (Sit to stand transfer) Pain Location: R knee  Pain Descriptors / Indicators: Aching;Grimacing;Operative site guarding Pain Intervention(s): Monitored during session  Home Living  Prior Functioning/Environment              Frequency  Min 2X/week        Progress Toward Goals  OT Goals(current goals can now be found in the care plan section)     Acute Rehab OT Goals Patient Stated Goal: To feel better and get back to cooking and taking care of the house.  OT Goal Formulation: With patient Time For Goal Achievement: 06/26/17 Potential to Achieve Goals: Good  Plan Discharge plan remains appropriate    Co-evaluation                 AM-PAC PT "6 Clicks" Daily Activity     Outcome Measure   Help from another person eating meals?: None Help from another person taking care of personal grooming?: None Help from another  person toileting, which includes using toliet, bedpan, or urinal?: None Help from another person bathing (including washing, rinsing, drying)?: A Little Help from another person to put on and taking off regular upper body clothing?: None Help from another person to put on and taking off regular lower body clothing?: A Little 6 Click Score: 22    End of Session Equipment Utilized During Treatment: Gait belt;Rolling walker  OT Visit Diagnosis: Unsteadiness on feet (R26.81);Pain Pain - Right/Left: Right Pain - part of body: Knee   Activity Tolerance Patient tolerated treatment well   Patient Left in chair;with call bell/phone within reach;with family/visitor present   Nurse Communication Mobility status        Time: 1610-9604 OT Time Calculation (min): 29 min  Charges: OT General Charges $OT Visit: 1 Visit OT Treatments $Self Care/Home Management : 23-37 mins  Cammy Copa, [place] #[phone]    Cammy Copa 06/13/2017, 12:39 PM

## 2017-06-14 NOTE — Care Management Note (Signed)
Case Management Note  Patient Details  Name: Zakee Deerman MRN: [id number] Date of Birth: 03-06-1956  Subjective/Objective:     62 yr old male s/p right total knee arthroplasty.               Action/Plan: Case manager spoke with patient's wife concerning discharge plan and DME. Patient was preoperatively setup with [facility], no changes. She states they are ordering a 3in1 from off [employer], will need RW. CM has requested RW. Patient will have family support at discharge.    Expected Discharge Date:  06/14/17               Expected Discharge Plan:  Home w Home Health Services  In-House Referral:  NA  Discharge planning Services  CM Consult  Post Acute Care Choice:  Durable Medical Equipment, Home Health Choice offered to:  Spouse  DME Arranged:  Dan Humphreys rolling (patient ordered 3in1 from [employer]) DME Agency:  [facility]  HH Arranged:  PT HH Agency:  [facility]  Status of Service:  Completed, signed off  If discussed at [employer], dates discussed:    Additional Comments:  Durenda Guthrie, RN 06/14/2017, 2:30 PM

## 2017-06-14 NOTE — Progress Notes (Signed)
Physical Therapy Treatment Patient Details Name: [name] Carroll MRN: [id number] DOB: 1956-03-09 Today's Date: 06/14/2017    History of Present Illness Pt is a 61 y/o male s/p elective R TKA. PMH includes HTN, concussion, and s/p cardiac cath.     PT Comments    Pt performed increased activity during session and reviewed stair training for d/c home.  PTA reviewed HEP with patient and wife and re-emphasized the frequency at home.  Plan for d/c home today with support from his wife.  Informed nursing that patient is ready for d/c home.   Follow Up Recommendations  DC plan and follow up therapy as arranged by surgeon;Supervision for mobility/OOB     Equipment Recommendations  Rolling walker with 5" wheels;3in1 (PT) (bariatric )    Recommendations for Other Services       Precautions / Restrictions Precautions Precautions: Knee Precaution Booklet Issued: Yes (comment) Precaution Comments: Reviewed knee precautions with patient to avoid placing objects under the knee and to rest in supported extension to encourage knee extension Restrictions Weight Bearing Restrictions: Yes RLE Weight Bearing: Weight bearing as tolerated    Mobility  Bed Mobility Overal bed mobility: Needs Assistance       Supine to sit: Min assist     General bed mobility comments: min assist to advance RLE to edge of bed.  HOB elevated with heavy reliance on railings.    Transfers Overall transfer level: Needs assistance Equipment used: Rolling walker (2 wheeled) Transfers: Sit to/from Stand Sit to Stand: Supervision         General transfer comment: Cues for hand placement to and from seated surface.  Pt required cues to advance RLE forward to decreased pain and improve ease of transfer.  Pt with improved eccentric loading.    Ambulation/Gait Ambulation/Gait assistance: Min assist Ambulation Distance (Feet): 250 Feet Assistive device: Rolling walker (2 wheeled) Gait Pattern/deviations: Decreased  weight shift to right;Antalgic;Step-through pattern Gait velocity: Decreased  Gait velocity interpretation: Below normal speed for age/gender General Gait Details: Pt required cues for pacing and R toe off and R knee flexion.     Stairs            Wheelchair Mobility    Modified Rankin (Stroke Patients Only)       Balance Overall balance assessment: Needs assistance Sitting-balance support: No upper extremity supported;Feet supported Sitting balance-Leahy Scale: Good       Standing balance-Leahy Scale: Fair Standing balance comment: Pt able to stand with no UE support with no LOB. Reliant on RW during functional mobility.                             Cognition Arousal/Alertness: Awake/alert Behavior During Therapy: WFL for tasks assessed/performed Overall Cognitive Status: Within Functional Limits for tasks assessed                                        Exercises Total Joint Exercises Ankle Circles/Pumps: AROM;Both;20 reps;Supine Quad Sets: AROM;Right;10 reps;Supine Towel Squeeze: AROM;Both;10 reps;Supine Short Arc Quad: AROM;Right;10 reps;Supine Heel Slides: AAROM;Right;10 reps;Supine Hip ABduction/ADduction: AROM;Right;10 reps;Supine Straight Leg Raises: AROM;Right;10 reps;Supine Goniometric ROM: 74 degrees flexion    General Comments        Pertinent Vitals/Pain Pain Assessment: 0-10 Pain Score: 4  Pain Location: R knee  Pain Descriptors / Indicators: Aching;Grimacing;Operative site guarding Pain Intervention(s): Monitored during  session;Repositioned;Ice applied    Home Living                      Prior Function            PT Goals (current goals can now be found in the care plan section) Acute Rehab PT Goals Patient Stated Goal: To feel better and get back to cooking and taking care of the house.  Potential to Achieve Goals: Good Progress towards PT goals: Progressing toward goals    Frequency     7X/week      PT Plan Current plan remains appropriate    Co-evaluation              AM-PAC PT "6 Clicks" Daily Activity  Outcome Measure  Difficulty turning over in bed (including adjusting bedclothes, sheets and blankets)?: A Little Difficulty moving from lying on back to sitting on the side of the bed? : Unable Difficulty sitting down on and standing up from a chair with arms (e.g., wheelchair, bedside commode, etc,.)?: A Lot Help needed moving to and from a bed to chair (including a wheelchair)?: A Little Help needed walking in hospital room?: A Little Help needed climbing 3-5 steps with a railing? : A Lot 6 Click Score: 14    End of Session Equipment Utilized During Treatment: Gait belt Activity Tolerance: Patient limited by pain Patient left: in chair;with call bell/phone within reach;with family/visitor present Nurse Communication: Mobility status PT Visit Diagnosis: Other abnormalities of gait and mobility (R26.89);Pain Pain - Right/Left: Right Pain - part of body: Knee     Time: 9562-1308 PT Time Calculation (min) (ACUTE ONLY): 25 min  Charges:  $Gait Training: 8-22 mins $Therapeutic Exercise: 8-22 mins                    G Codes:       [name] Carroll, PTA pager [phone]    [name] Carroll 06/14/2017, 10:07 AM

## 2017-06-14 NOTE — Progress Notes (Signed)
Physical Therapy Treatment Patient Details Name: [name] Carroll MRN: [id number] DOB: 01-27-1956 Today's Date: 06/14/2017    History of Present Illness Pt is a 61 y/o male s/p elective R TKA. PMH includes HTN, concussion, and s/p cardiac cath.     PT Comments    Pt performed transfer back to bed with education provided for family training during functional mobility.  Upon entry into the room patient with increased pain and complaints of nausea.  Pt refused further treatment and treatment limited to transfer back to bed.  Pt placed in supine in supported knee extension with ice packs donned.    Follow Up Recommendations  DC plan and follow up therapy as arranged by surgeon;Supervision for mobility/OOB     Equipment Recommendations  Rolling walker with 5" wheels;3in1 (PT) (bariatric)    Recommendations for Other Services       Precautions / Restrictions Precautions Precautions: Knee Precaution Booklet Issued: Yes (comment) Restrictions Weight Bearing Restrictions: Yes RLE Weight Bearing: Weight bearing as tolerated    Mobility  Bed Mobility Overal bed mobility: Needs Assistance         Sit to supine: Min assist   General bed mobility comments: Family education with cues provided to spouse for back to bed transfer.  Pt remains to require min assist to lift RLE into bed against gravity.    Transfers Overall transfer level: Needs assistance Equipment used: Rolling walker (2 wheeled) Transfers: Sit to/from Stand Sit to Stand: Supervision         General transfer comment: Pt demonstrated good technique to and from seated surface.  Cues for forward advancement of RLE during descent to decrease pain and improve function.    Ambulation/Gait Ambulation/Gait assistance: Supervision Ambulation Distance (Feet):  (steps from bed to chair.  ) Assistive device: Rolling walker (2 wheeled) Gait Pattern/deviations: Decreased weight shift to right;Antalgic;Step-through pattern      General Gait Details: Cues for RW safety, turning and backing.     Stairs            Wheelchair Mobility    Modified Rankin (Stroke Patients Only)       Balance                                            Cognition Arousal/Alertness: Awake/alert Behavior During Therapy: WFL for tasks assessed/performed Overall Cognitive Status: Within Functional Limits for tasks assessed                                        Exercises      General Comments        Pertinent Vitals/Pain Pain Assessment: 0-10 Pain Score: 9  Pain Location: R knee  Pain Descriptors / Indicators: Aching;Grimacing;Operative site guarding Pain Intervention(s): Monitored during session;Limited activity within patient's tolerance;Repositioned;Ice applied    Home Living                      Prior Function            PT Goals (current goals can now be found in the care plan section) Acute Rehab PT Goals Patient Stated Goal: To feel better and get back to cooking and taking care of the house.  Potential to Achieve Goals: Good Progress towards PT goals: Progressing toward  goals    Frequency    7X/week      PT Plan Current plan remains appropriate    Co-evaluation              AM-PAC PT "6 Clicks" Daily Activity  Outcome Measure  Difficulty turning over in bed (including adjusting bedclothes, sheets and blankets)?: A Little Difficulty moving from lying on back to sitting on the side of the bed? : Unable Difficulty sitting down on and standing up from a chair with arms (e.g., wheelchair, bedside commode, etc,.)?: A Lot Help needed moving to and from a bed to chair (including a wheelchair)?: A Little Help needed walking in hospital room?: A Little Help needed climbing 3-5 steps with a railing? : A Lot 6 Click Score: 14    End of Session   Activity Tolerance: Patient limited by pain Patient left: in chair;with call bell/phone within  reach;with family/visitor present Nurse Communication: Mobility status PT Visit Diagnosis: Other abnormalities of gait and mobility (R26.89);Pain Pain - Right/Left: Right Pain - part of body: Knee     Time: 1610-9604 PT Time Calculation (min) (ACUTE ONLY): 12 min  Charges:  $Therapeutic Activity: 8-22 mins                    G Codes:       Joycelyn Rua, PTA pager [phone]    TERY HOEGER 06/14/2017, 2:59 PM

## 2017-06-16 DIAGNOSIS — Z79891 Long term (current) use of opiate analgesic: Secondary | ICD-10-CM | POA: Diagnosis not present

## 2017-06-16 DIAGNOSIS — Z96651 Presence of right artificial knee joint: Secondary | ICD-10-CM | POA: Diagnosis not present

## 2017-06-16 DIAGNOSIS — M1991 Primary osteoarthritis, unspecified site: Secondary | ICD-10-CM | POA: Diagnosis not present

## 2017-06-16 DIAGNOSIS — Z7982 Long term (current) use of aspirin: Secondary | ICD-10-CM | POA: Diagnosis not present

## 2017-06-16 DIAGNOSIS — Z87442 Personal history of urinary calculi: Secondary | ICD-10-CM | POA: Diagnosis not present

## 2017-06-16 DIAGNOSIS — Z471 Aftercare following joint replacement surgery: Secondary | ICD-10-CM | POA: Diagnosis not present

## 2017-06-16 DIAGNOSIS — I10 Essential (primary) hypertension: Secondary | ICD-10-CM | POA: Diagnosis not present

## 2017-06-17 ENCOUNTER — Telehealth (INDEPENDENT_AMBULATORY_CARE_PROVIDER_SITE_OTHER): Payer: Self-pay | Admitting: Orthopedic Surgery

## 2017-06-17 NOTE — Telephone Encounter (Signed)
I called and spoke with Byrd Hesselbach giving her the verbal for home health PT.

## 2017-06-17 NOTE — Telephone Encounter (Signed)
[name] Carroll from [facility] called this morning requesting verbal orders for [facility] PT:  3x for 2 weeks  CB#[phone].  Thank you

## 2017-06-18 ENCOUNTER — Telehealth (INDEPENDENT_AMBULATORY_CARE_PROVIDER_SITE_OTHER): Payer: Self-pay

## 2017-06-18 ENCOUNTER — Other Ambulatory Visit (INDEPENDENT_AMBULATORY_CARE_PROVIDER_SITE_OTHER): Payer: Self-pay | Admitting: Orthopedic Surgery

## 2017-06-18 DIAGNOSIS — M1991 Primary osteoarthritis, unspecified site: Secondary | ICD-10-CM | POA: Diagnosis not present

## 2017-06-18 DIAGNOSIS — Z471 Aftercare following joint replacement surgery: Secondary | ICD-10-CM | POA: Diagnosis not present

## 2017-06-18 DIAGNOSIS — I10 Essential (primary) hypertension: Secondary | ICD-10-CM | POA: Diagnosis not present

## 2017-06-18 DIAGNOSIS — Z87442 Personal history of urinary calculi: Secondary | ICD-10-CM | POA: Diagnosis not present

## 2017-06-18 DIAGNOSIS — Z96651 Presence of right artificial knee joint: Secondary | ICD-10-CM | POA: Diagnosis not present

## 2017-06-18 DIAGNOSIS — Z79891 Long term (current) use of opiate analgesic: Secondary | ICD-10-CM | POA: Diagnosis not present

## 2017-06-18 DIAGNOSIS — Z7982 Long term (current) use of aspirin: Secondary | ICD-10-CM | POA: Diagnosis not present

## 2017-06-18 MED ORDER — METHOCARBAMOL 500 MG PO TABS: 500.0000 mg | ORAL_TABLET | Freq: Three times a day (TID) | ORAL | 0 refills | Status: DC

## 2017-06-18 NOTE — Telephone Encounter (Signed)
Patient would like a Rx for a muscle relaxer.  CB# is [phone].  Please advise.  Thank You.

## 2017-06-18 NOTE — Telephone Encounter (Signed)
I called to advise patient of message below. We will see him at post op appointment tomorrow.

## 2017-06-18 NOTE — Telephone Encounter (Signed)
Prescription sent to [facility] [place] on [address] [facility]

## 2017-06-19 ENCOUNTER — Encounter (INDEPENDENT_AMBULATORY_CARE_PROVIDER_SITE_OTHER): Payer: Self-pay | Admitting: Family

## 2017-06-19 ENCOUNTER — Ambulatory Visit (INDEPENDENT_AMBULATORY_CARE_PROVIDER_SITE_OTHER): Payer: BLUE CROSS/BLUE SHIELD | Admitting: Family

## 2017-06-19 DIAGNOSIS — Z96651 Presence of right artificial knee joint: Secondary | ICD-10-CM

## 2017-06-19 MED ORDER — CYCLOBENZAPRINE HCL 10 MG PO TABS: 10.0000 mg | ORAL_TABLET | Freq: Three times a day (TID) | ORAL | 0 refills | Status: DC | PRN

## 2017-06-19 NOTE — Progress Notes (Signed)
   Post-Op Visit Note   Patient: [name] Carroll           Date of Birth: 1956/03/30           MRN: [id number] Visit Date: 06/19/2017 PCP: Jarome Matin, MD  Chief Complaint:  Chief Complaint  Patient presents with  . Right Knee - Follow-up    HPI:  HPI The patient is a 61 year old gentleman who is seen today one week status post right total knee arthroplasty. Continues to have pain and stiffness. Progressing with home physical therapy. Ortho Exam Incision well proximally with staples there is no erythema drainage no sign of infection. Moderate swelling to the knee. Has full extension and flexion to 90  Visit Diagnoses:  1. Total knee replacement status, right     Plan: Continue with home physical therapy may shower may get this wet continue dry dressings daily follow-up in office in 1 week radiographs of the knee. Plan to harvest staples at that time.  Follow-Up Instructions: Return in about 1 week (around 06/26/2017).   Imaging: No results found.  Orders:  No orders of the defined types were placed in this encounter.  No orders of the defined types were placed in this encounter.    PMFS History: Patient Active Problem List   Diagnosis Date Noted  . Total knee replacement status, right 06/12/2017  . Unilateral primary osteoarthritis, right knee   . Bilateral primary osteoarthritis of knee 04/11/2017  . Concussion 12/26/2016   Past Medical History:  Diagnosis Date  . Arthritis    "knees, wrists, knuckles" (06/12/2017)  . Concussion 12/2016   "hit head on shelf"  . High cholesterol   . History of kidney stones   . Hypertension     No family history on file.  Past Surgical History:  Procedure Laterality Date  . APPENDECTOMY    . CARDIAC CATHETERIZATION    . KNEE ARTHROSCOPY W/ MENISCAL REPAIR Left   . TOTAL KNEE ARTHROPLASTY Right 06/12/2017  . TOTAL KNEE ARTHROPLASTY Right 06/12/2017   Procedure: RIGHT TOTAL KNEE ARTHROPLASTY;  Surgeon: [name], MD;   Location: [facility] OR;  Service: Orthopedics;  Laterality: Right;  . WRIST ARTHROSCOPY Right    "separated wrist"   Social History   Occupational History  . Not on file.   Social History Main Topics  . Smoking status: Never Smoker  . Smokeless tobacco: Never Used  . Alcohol use 2.4 oz/week    4 Shots of liquor per week  . Drug use: No  . Sexual activity: Not on file

## 2017-06-20 DIAGNOSIS — Z79891 Long term (current) use of opiate analgesic: Secondary | ICD-10-CM | POA: Diagnosis not present

## 2017-06-20 DIAGNOSIS — Z96651 Presence of right artificial knee joint: Secondary | ICD-10-CM | POA: Diagnosis not present

## 2017-06-20 DIAGNOSIS — Z87442 Personal history of urinary calculi: Secondary | ICD-10-CM | POA: Diagnosis not present

## 2017-06-20 DIAGNOSIS — Z471 Aftercare following joint replacement surgery: Secondary | ICD-10-CM | POA: Diagnosis not present

## 2017-06-20 DIAGNOSIS — Z7982 Long term (current) use of aspirin: Secondary | ICD-10-CM | POA: Diagnosis not present

## 2017-06-20 DIAGNOSIS — I10 Essential (primary) hypertension: Secondary | ICD-10-CM | POA: Diagnosis not present

## 2017-06-20 DIAGNOSIS — M1991 Primary osteoarthritis, unspecified site: Secondary | ICD-10-CM | POA: Diagnosis not present

## 2017-06-23 ENCOUNTER — Emergency Department (HOSPITAL_COMMUNITY)
Admission: EM | Admit: 2017-06-23 | Discharge: 2017-06-23 | Disposition: A | Discharge status: Home / Self Care | Payer: BLUE CROSS/BLUE SHIELD | Attending: Physician Assistant | Admitting: Physician Assistant

## 2017-06-23 ENCOUNTER — Encounter (HOSPITAL_COMMUNITY): Payer: Self-pay | Admitting: *Deleted

## 2017-06-23 DIAGNOSIS — R2241 Localized swelling, mass and lump, right lower limb: Secondary | ICD-10-CM | POA: Diagnosis not present

## 2017-06-23 DIAGNOSIS — M79661 Pain in right lower leg: Secondary | ICD-10-CM | POA: Diagnosis not present

## 2017-06-23 DIAGNOSIS — Z7982 Long term (current) use of aspirin: Secondary | ICD-10-CM | POA: Insufficient documentation

## 2017-06-23 DIAGNOSIS — Z79899 Other long term (current) drug therapy: Secondary | ICD-10-CM | POA: Diagnosis not present

## 2017-06-23 DIAGNOSIS — I10 Essential (primary) hypertension: Secondary | ICD-10-CM | POA: Diagnosis not present

## 2017-06-23 DIAGNOSIS — M7989 Other specified soft tissue disorders: Secondary | ICD-10-CM | POA: Diagnosis not present

## 2017-06-23 LAB — COMPREHENSIVE METABOLIC PANEL
ALBUMIN: 3.3 g/dL — AB (ref 3.5–5.0)
ALK PHOS: 75 U/L (ref 38–126)
ALT: 24 U/L (ref 17–63)
ANION GAP: 9 (ref 5–15)
AST: 24 U/L (ref 15–41)
BILIRUBIN TOTAL: 0.7 mg/dL (ref 0.3–1.2)
BUN: 24 mg/dL — AB (ref 6–20)
CALCIUM: 8.9 mg/dL (ref 8.9–10.3)
CO2: 25 mmol/L (ref 22–32)
CREATININE: 1.39 mg/dL — AB (ref 0.61–1.24)
Chloride: 97 mmol/L — ABNORMAL LOW (ref 101–111)
GFR calc Af Amer: >60 mL/min (ref >60)
GFR calc non Af Amer: 54 mL/min — ABNORMAL LOW (ref >60)
GLUCOSE: 115 mg/dL — AB (ref 65–99)
Potassium: 4.4 mmol/L (ref 3.5–5.1)
Sodium: 131 mmol/L — ABNORMAL LOW (ref 135–145)
TOTAL PROTEIN: 6.7 g/dL (ref 6.5–8.1)

## 2017-06-23 LAB — CBC AND DIFFERENTIAL
HEMATOCRIT: 37 — AB (ref 41–53)
HEMOGLOBIN: 11.9 — AB (ref 13.5–17.5)
WBC: 8.7

## 2017-06-23 LAB — HEPATIC FUNCTION PANEL
ALK PHOS: 75 (ref 25–125)
ALT: 24 (ref 10–40)
AST: 24 (ref 14–40)
BILIRUBIN, TOTAL: 0.7

## 2017-06-23 LAB — BASIC METABOLIC PANEL
BUN: 24 — AB (ref 4–21)
Creatinine: 1.4 — AB (ref <=1.3)
GLUCOSE: 115
Potassium: 4.4 (ref 3.4–5.3)
SODIUM: 131 — AB (ref 137–147)

## 2017-06-23 LAB — CBC
HCT: 37 % — ABNORMAL LOW (ref 39.0–52.0)
HEMOGLOBIN: 11.9 g/dL — AB (ref 13.0–17.0)
MCH: 27.6 pg (ref 26.0–34.0)
MCHC: 32.2 g/dL (ref 30.0–36.0)
MCV: 85.8 fL (ref 78.0–100.0)
Platelets: 241 10*3/uL (ref 150–400)
RBC: 4.31 MIL/uL (ref 4.22–5.81)
RDW: 14.5 % (ref 11.5–15.5)
WBC: 8.7 10*3/uL (ref 4.0–10.5)

## 2017-06-23 LAB — I-STAT CG4 LACTIC ACID, ED: LACTIC ACID, VENOUS: 0.72 mmol/L (ref 0.5–1.9)

## 2017-06-23 MED ORDER — HYDROCODONE-ACETAMINOPHEN 5-325 MG PO TABS
2.0000 | ORAL_TABLET | Freq: Once | ORAL | Status: AC
Start: 2017-06-23 — End: 2017-06-23
  Administered 2017-06-23: 2 via ORAL
  Filled 2017-06-23: qty 2

## 2017-06-23 MED ORDER — ENOXAPARIN SODIUM 150 MG/ML ~~LOC~~ SOLN
150.0000 mg | Freq: Once | SUBCUTANEOUS | Status: AC
  Administered 2017-06-23: 150 mg via SUBCUTANEOUS
  Filled 2017-06-23: qty 1

## 2017-06-23 NOTE — ED Notes (Signed)
Patient able to ambulate independently with limp at baseline post-surgery

## 2017-06-23 NOTE — ED Triage Notes (Signed)
Thew pt had rt knee replacement sept 26  Since yesterday he has had much swelling and pain in his entire leg  With rt calf pain

## 2017-06-23 NOTE — ED Provider Notes (Signed)
[facility]-EMERGENCY DEPT Provider Note   CSN: [id number] Arrival date & time: 06/23/17  1925     History   Chief Complaint Chief Complaint  Patient presents with  . Leg Swelling    HPI [name] Carroll is a 61 y.o. male who is status post total knee replacement on 06/12/17 presents with 2 days of right lower extremity pain, swelling, redness. Patient states that approximately 2 days ago, he started experiencing some pain to the distal portion of his right lower extremity. Patient states that at baseline he'll have intermittent swelling to bilateral lower extremities but is noticed in the last 2 days, the right lower extremity has had increasingly worse edema. He also notes some redness and discoloration noted to the distal portion of the right lower extremity. Patient states that today, he started experiencing some chest pain. He called his orthopedic surgeon who prompted her ED visit for further evaluation. Patient states that he was having some numbness noted to the distal femur area. Patient also notes that he was having some coldness to the foot but states that once he took off the Ace bandage on the leg, that resolved. Patient denies any recent fevers, chills, chest pain, difficulty breathing. He is currently on aspirin daily but no other blood thinners.  The history is provided by the patient.    Past Medical History:  Diagnosis Date  . Arthritis    "knees, wrists, knuckles" (06/12/2017)  . Concussion 12/2016   "hit head on shelf"  . High cholesterol   . History of kidney stones   . Hypertension     Patient Active Problem List   Diagnosis Date Noted  . Total knee replacement status, right 06/12/2017  . Unilateral primary osteoarthritis, right knee   . Bilateral primary osteoarthritis of knee 04/11/2017  . Concussion 12/26/2016    Past Surgical History:  Procedure Laterality Date  . APPENDECTOMY    . CARDIAC CATHETERIZATION    . KNEE ARTHROSCOPY W/ MENISCAL REPAIR Left   .  TOTAL KNEE ARTHROPLASTY Right 06/12/2017  . TOTAL KNEE ARTHROPLASTY Right 06/12/2017   Procedure: RIGHT TOTAL KNEE ARTHROPLASTY;  Surgeon: [name], MD;  Location: [facility] OR;  Service: Orthopedics;  Laterality: Right;  . WRIST ARTHROSCOPY Right    "separated wrist"       Home Medications    Prior to Admission medications   Medication Sig Start Date End Date Taking? Authorizing Provider  acetaminophen (TYLENOL) 325 MG tablet Take 650 mg by mouth every 6 (six) hours as needed for mild pain.   Yes [provider]  aspirin EC 325 MG tablet Take 1 tablet (325 mg total) by mouth daily. 06/13/17  Yes [name], MD  azelastine (ASTELIN) 0.1 % nasal spray Place 2 sprays into both nostrils at bedtime. Use in each nostril as directed   Yes [provider]  cyclobenzaprine (FLEXERIL) 10 MG tablet Take 1 tablet (10 mg total) by mouth 3 (three) times daily as needed for muscle spasms. 06/19/17  Yes Barnie Del R, NP  ezetimibe-simvastatin (VYTORIN) 10-40 MG tablet Take 1 tablet by mouth at bedtime.   Yes [provider]  famotidine (PEPCID) 20 MG tablet Take 20 mg by mouth at bedtime.    Yes [provider]  fluticasone (FLONASE) 50 MCG/ACT nasal spray Place 2 sprays into both nostrils at bedtime.   Yes [provider]  levocetirizine (XYZAL) 5 MG tablet Take 5 mg by mouth at bedtime.    Yes [provider]  lisinopril (PRINIVIL,ZESTRIL) 10 MG tablet Take 10 mg by mouth at bedtime.    Yes [provider]  Magnesium 500 MG CAPS Take 500 mg by mouth 2 (two) times daily.   Yes [provider]  Multiple Vitamin (MULTIVITAMIN WITH MINERALS) TABS tablet Take 1 tablet by mouth daily.   Yes [provider]  Omega-3 Fatty Acids ([employer] FISH OIL) 1200 MG CAPS Take 1,200 mg by mouth daily with breakfast.   Yes [provider]  oxyCODONE-acetaminophen (ROXICET) 5-325 MG tablet Take 1 tablet by mouth every 4 (four) hours as  needed for severe pain. 06/13/17  Yes [name], MD  spironolactone (ALDACTONE) 25 MG tablet Take 25 mg by mouth daily.   Yes [provider]  verapamil (CALAN) 120 MG tablet Take 120 mg by mouth 2 (two) times daily. 06/04/17  Yes [provider]  vitamin E 400 UNIT capsule Take 400 Units by mouth daily.   Yes [provider]    Family History No family history on file.  Social History Social History  Substance Use Topics  . Smoking status: Never Smoker  . Smokeless tobacco: Never Used  . Alcohol use 2.4 oz/week    4 Shots of liquor per week     Allergies   Sulfa antibiotics   Review of Systems Review of Systems  Constitutional: Negative for chills and fever.  HENT: Negative for congestion.   Respiratory: Negative for cough and shortness of breath.   Cardiovascular: Positive for leg swelling. Negative for chest pain.  Gastrointestinal: Negative for nausea and vomiting.  Musculoskeletal: Negative for back pain and neck pain.  Skin: Positive for color change.  Neurological: Positive for numbness (Resolved). Negative for weakness.     Physical Exam Updated Vital Signs BP 119/72   Pulse 89   Temp 98.6 F (37 C)   Resp 20   Ht  (1.727 m)   Wt (!) 155.1 kg (342 lb)   SpO2 98%   BMI 52.00 kg/m   Physical Exam  Constitutional: He is oriented to person, place, and time. He appears well-developed and well-nourished.  Sitting comfortably on examination table  HENT:  Head: Normocephalic and atraumatic.  Mouth/Throat: Oropharynx is clear and moist and mucous membranes are normal.  Eyes: Pupils are equal, round, and reactive to light. Conjunctivae, EOM and lids are normal.  Neck: Full passive range of motion without pain.  Cardiovascular: Normal rate, regular rhythm, normal heart sounds and normal pulses.  Exam reveals no gallop and no friction rub.   No murmur heard. Pulses:      Dorsalis pedis pulses are 2+ on the right side, and 2+  on the left side.  Limited palpable secondary to edema but still present. Good Doppler pulses.  Pulmonary/Chest: Effort normal and breath sounds normal.  No evidence of respiratory distress. Able to speak in full sentences without difficulty.  Musculoskeletal: Normal range of motion.  2+ pitting edema noted to bilateral lower extremity, right is slightly greater than the left. Shin to the right lower calf that extends down to the distal portion of the right lower extremity. Mild overlying erythema, no warmth, induration. Limited range of motion of right lower extremity secondary to pain but good dorsiflexion and plantar flexion of right ankle. No tenderness palpation to the left lower extremity. Soft compartments. Right knee is without any edema, erythema, warmth, tenderness.  Neurological: He is alert and oriented to person, place, and time.  Follows commands, Moves all extremities  5/5 strength to BUE and BLE  Patient initially complained of some subjective numbness to the lateral aspect of the upper thigh. He is able to feel sharp sensation to the lateral thigh. No sensation loss to the distal right lower extremity.  Skin: Skin is warm and dry. Capillary refill takes less than 2 seconds.  Good cap refill to distal right lower extremity. Right lower extremity is not cold to touch or dusky appearance. Well-appearing surgical incision to the anterior aspect of the right knee staples in place. No surrounding warmth, erythema. No purulent drainage noted.  Psychiatric: He has a normal mood and affect. His speech is normal.  Nursing note and vitals reviewed.    ED Treatments / Results  Labs (all labs ordered are listed, but only abnormal results are displayed) Labs Reviewed  COMPREHENSIVE METABOLIC PANEL - Abnormal; Notable for the following:       Result Value   Sodium 131 (*)    Chloride 97 (*)    Glucose, Bld 115 (*)    BUN 24 (*)    Creatinine, Ser 1.39 (*)    Albumin 3.3 (*)    GFR calc  non Af Amer 54 (*)    All other components within normal limits  CBC - Abnormal; Notable for the following:    Hemoglobin 11.9 (*)    HCT 37.0 (*)    All other components within normal limits  I-STAT CG4 LACTIC ACID, ED    EKG  EKG Interpretation None       Radiology No results found.  Procedures Procedures (including critical care time)  Medications Ordered in ED Medications  enoxaparin (LOVENOX) injection 150 mg (150 mg Subcutaneous Given 06/23/17 2317)  HYDROcodone-acetaminophen (NORCO/VICODIN) 5-325 MG per tablet 2 tablet (2 tablets Oral Given 06/23/17 2234)     Initial Impression / Assessment and Plan / ED Course  I have reviewed the triage vital signs and the nursing notes.  Pertinent labs & imaging results that were available during my care of the patient were reviewed by me and considered in my medical decision making (see chart for details).     61 year old male who presents with 2 days of right lower extremity pain/swelling/redness. He is status post total knee replacement on 06/12/17. Initially had some numbness but resolved. Patient is afebrile, non-toxic appearing, sitting comfortably on examination table. Patient has good cap refill and good distal pulses. Vital signs reviewed and stable. O2 sats are greater than 95% on room air. No evidence of tachycardia. No chest pain or shortness of breath. Concern for DVT, particularly given postop status. History/physical exam are not concerning for septic arthritis, compartment syndrome. Initial labs ordered at triage. Cr 1.39 but records reviewed to this consistent with previous. Plan to give Lovenox here in the department for treatment of DVT. Will plan outpatient follow-up to the hospital tomorrow morning for ultrasound evaluation to rule out DVT. Patient instructed to call his orthopedic surgeon or to arrange for follow-up. He and wife are agreeable to plan. Strict return precautions discussed. Patient expresses understanding  and agreement to plan.    Final Clinical Impressions(s) / ED Diagnoses   Final diagnoses:  Right leg swelling    New Prescriptions Discharge Medication List as of 06/23/2017 10:59 PM       Maxwell Caul, PA-C 06/23/17 2340    Mackuen, Cindee Salt, MD 06/26/17 2203

## 2017-06-23 NOTE — Discharge Instructions (Signed)
Call Dr. Lajoyce Corners in the morning for an appointment to be seen.  As we discussed, he'll return to the hospital at 8 AM tomorrow morning. He will come to the main entrance of the hospital to get an ultrasound of the leg to make sure that there is a blood clot in the leg.  Return to the emergency department for any worsening pain, chest pain, difficulty breathing, numbness or weakness in the leg, fevers or any other worsening or concerning symptoms.

## 2017-06-23 NOTE — ED Triage Notes (Signed)
Pedal pulse dopplered strong

## 2017-06-24 ENCOUNTER — Ambulatory Visit (INDEPENDENT_AMBULATORY_CARE_PROVIDER_SITE_OTHER): Payer: BLUE CROSS/BLUE SHIELD | Admitting: Orthopedic Surgery

## 2017-06-24 ENCOUNTER — Ambulatory Visit (INDEPENDENT_AMBULATORY_CARE_PROVIDER_SITE_OTHER): Payer: BLUE CROSS/BLUE SHIELD

## 2017-06-24 ENCOUNTER — Ambulatory Visit (HOSPITAL_COMMUNITY)
Admission: RE | Admit: 2017-06-24 | Discharge: 2017-06-24 | Disposition: A | Discharge status: Home / Self Care | Payer: BLUE CROSS/BLUE SHIELD | Source: Ambulatory Visit | Attending: Emergency Medicine | Admitting: Emergency Medicine

## 2017-06-24 ENCOUNTER — Telehealth (INDEPENDENT_AMBULATORY_CARE_PROVIDER_SITE_OTHER): Payer: Self-pay | Admitting: Radiology

## 2017-06-24 ENCOUNTER — Encounter (INDEPENDENT_AMBULATORY_CARE_PROVIDER_SITE_OTHER): Payer: Self-pay | Admitting: Orthopedic Surgery

## 2017-06-24 DIAGNOSIS — M79661 Pain in right lower leg: Secondary | ICD-10-CM | POA: Diagnosis not present

## 2017-06-24 DIAGNOSIS — Z96651 Presence of right artificial knee joint: Secondary | ICD-10-CM | POA: Diagnosis not present

## 2017-06-24 DIAGNOSIS — M25561 Pain in right knee: Secondary | ICD-10-CM

## 2017-06-24 DIAGNOSIS — M25562 Pain in left knee: Secondary | ICD-10-CM

## 2017-06-24 DIAGNOSIS — M7989 Other specified soft tissue disorders: Secondary | ICD-10-CM | POA: Diagnosis not present

## 2017-06-24 DIAGNOSIS — I10 Essential (primary) hypertension: Secondary | ICD-10-CM | POA: Diagnosis not present

## 2017-06-24 DIAGNOSIS — M79609 Pain in unspecified limb: Secondary | ICD-10-CM | POA: Diagnosis not present

## 2017-06-24 DIAGNOSIS — Z471 Aftercare following joint replacement surgery: Secondary | ICD-10-CM | POA: Diagnosis not present

## 2017-06-24 DIAGNOSIS — Z7982 Long term (current) use of aspirin: Secondary | ICD-10-CM | POA: Diagnosis not present

## 2017-06-24 DIAGNOSIS — R609 Edema, unspecified: Secondary | ICD-10-CM | POA: Diagnosis not present

## 2017-06-24 DIAGNOSIS — Z87442 Personal history of urinary calculi: Secondary | ICD-10-CM | POA: Diagnosis not present

## 2017-06-24 DIAGNOSIS — Z79891 Long term (current) use of opiate analgesic: Secondary | ICD-10-CM | POA: Diagnosis not present

## 2017-06-24 DIAGNOSIS — M1991 Primary osteoarthritis, unspecified site: Secondary | ICD-10-CM | POA: Diagnosis not present

## 2017-06-24 NOTE — Telephone Encounter (Signed)
Wife called and said that patient was having pain and swelling in the leg, was told by on call MD to go to [facility] to get [place] to r/o DVT.  [facility], waiting for [place], ED gave him a blood thinner last night.  They have appt Wed with Lajoyce Corners.  Please call wife to advise if he needs to come in sooner?  Or what they need to do once results from doppler are in.

## 2017-06-24 NOTE — Progress Notes (Signed)
VASCULAR LAB PRELIMINARY  PRELIMINARY  PRELIMINARY  PRELIMINARY  Right lower extremity venous duplex completed.    Preliminary report:  There is no DVT, SVT, or Baker's cyst noted in the right lower extremity.   Lynnley Doddridge, RVT 06/24/2017, 8:44 AM

## 2017-06-24 NOTE — Telephone Encounter (Signed)
Made patient an appointment today at 1:00pm.

## 2017-06-24 NOTE — Progress Notes (Signed)
Office Visit Note   Patient: [name] Carroll           Date of Birth: 08-05-1956           MRN: [id number] Visit Date: 06/24/2017              Requested by: Jarome Matin, MD [address] [place], [place] [address] PCP: Jarome Matin, MD  Chief Complaint  Patient presents with  . Right Knee - Routine Post Op    06/12/17 R TKA      HPI: Patient is a 61 year old gentleman who is 2 weeks status post right total knee arthroplasty. Patient states that last night he had acute onset of swelling and pain he was referred to the emergency room for ultrasound this was negative for DVT patient has been on aspirin.  Assessment & Plan: Visit Diagnoses:  1. Acute pain of right knee   2. Total knee replacement status, right     Plan: Recommended wearing the knee-high compression stockings around-the-clock continue the aspirin and continue exercise continue elevation. Staples harvested Steri-Strips applied.  Follow-Up Instructions: Return in about 1 week (around 07/01/2017).   Ortho Exam  Patient is alert, oriented, no adenopathy, well-dressed, normal affect, normal respiratory effort. On examination patient's calf is 56 cm in circumference. His calf is swollen he has ecchymosis and bruising down to the ankle. There is noted tenderness to palpation medially along the calf or thigh. No pain along the greater saphenous vein distribution. There is no redness no cellulitis no drainage no signs of infection the incision is well-healed.  Imaging: Xr Knee 1-2 Views Right  Result Date: 06/24/2017 2 view radiographs of the right knee shows total knee arthroplasty with stable alignment with no varus or valgus deformity. No fractures  No images are attached to the encounter.  Labs: No results found for: HGBA1C, ESRSEDRATE, CRP, LABURIC, REPTSTATUS, GRAMSTAIN, CULT, LABORGA  Orders:  Orders Placed This Encounter  Procedures  . XR Knee 1-2 Views Right   No orders of the defined types were  placed in this encounter.    Procedures: No procedures performed  Clinical Data: No additional findings.  ROS:  All other systems negative, except as noted in the HPI. Review of Systems  Objective: Vital Signs: There were no vitals taken for this visit.  Specialty Comments:  No specialty comments available.  PMFS History: Patient Active Problem List   Diagnosis Date Noted  . Acute pain of right knee 06/24/2017  . Total knee replacement status, right 06/12/2017  . Unilateral primary osteoarthritis, right knee   . Bilateral primary osteoarthritis of knee 04/11/2017  . Concussion 12/26/2016   Past Medical History:  Diagnosis Date  . Arthritis    "knees, wrists, knuckles" (06/12/2017)  . Concussion 12/2016   "hit head on shelf"  . High cholesterol   . History of kidney stones   . Hypertension     History reviewed. No pertinent family history.  Past Surgical History:  Procedure Laterality Date  . APPENDECTOMY    . CARDIAC CATHETERIZATION    . KNEE ARTHROSCOPY W/ MENISCAL REPAIR Left   . TOTAL KNEE ARTHROPLASTY Right 06/12/2017  . TOTAL KNEE ARTHROPLASTY Right 06/12/2017   Procedure: RIGHT TOTAL KNEE ARTHROPLASTY;  Surgeon: [name], MD;  Location: [facility] OR;  Service: Orthopedics;  Laterality: Right;  . WRIST ARTHROSCOPY Right    "separated wrist"   Social History   Occupational History  . Not on file.   Social History Main Topics  .  Smoking status: Never Smoker  . Smokeless tobacco: Never Used  . Alcohol use 2.4 oz/week    4 Shots of liquor per week  . Drug use: No  . Sexual activity: Not on file

## 2017-06-26 ENCOUNTER — Ambulatory Visit (INDEPENDENT_AMBULATORY_CARE_PROVIDER_SITE_OTHER): Payer: BLUE CROSS/BLUE SHIELD | Admitting: Family

## 2017-06-26 ENCOUNTER — Encounter (INDEPENDENT_AMBULATORY_CARE_PROVIDER_SITE_OTHER): Payer: Self-pay

## 2017-06-26 DIAGNOSIS — Z7982 Long term (current) use of aspirin: Secondary | ICD-10-CM | POA: Diagnosis not present

## 2017-06-26 DIAGNOSIS — Z96651 Presence of right artificial knee joint: Secondary | ICD-10-CM | POA: Diagnosis not present

## 2017-06-26 DIAGNOSIS — Z79891 Long term (current) use of opiate analgesic: Secondary | ICD-10-CM | POA: Diagnosis not present

## 2017-06-26 DIAGNOSIS — I10 Essential (primary) hypertension: Secondary | ICD-10-CM | POA: Diagnosis not present

## 2017-06-26 DIAGNOSIS — Z87442 Personal history of urinary calculi: Secondary | ICD-10-CM | POA: Diagnosis not present

## 2017-06-26 DIAGNOSIS — M1991 Primary osteoarthritis, unspecified site: Secondary | ICD-10-CM | POA: Diagnosis not present

## 2017-06-26 DIAGNOSIS — Z471 Aftercare following joint replacement surgery: Secondary | ICD-10-CM | POA: Diagnosis not present

## 2017-06-27 DIAGNOSIS — Z7982 Long term (current) use of aspirin: Secondary | ICD-10-CM | POA: Diagnosis not present

## 2017-06-27 DIAGNOSIS — Z96651 Presence of right artificial knee joint: Secondary | ICD-10-CM | POA: Diagnosis not present

## 2017-06-27 DIAGNOSIS — I10 Essential (primary) hypertension: Secondary | ICD-10-CM | POA: Diagnosis not present

## 2017-06-27 DIAGNOSIS — M1991 Primary osteoarthritis, unspecified site: Secondary | ICD-10-CM | POA: Diagnosis not present

## 2017-06-27 DIAGNOSIS — Z87442 Personal history of urinary calculi: Secondary | ICD-10-CM | POA: Diagnosis not present

## 2017-06-27 DIAGNOSIS — Z79891 Long term (current) use of opiate analgesic: Secondary | ICD-10-CM | POA: Diagnosis not present

## 2017-06-27 DIAGNOSIS — Z471 Aftercare following joint replacement surgery: Secondary | ICD-10-CM | POA: Diagnosis not present

## 2017-07-01 ENCOUNTER — Ambulatory Visit (INDEPENDENT_AMBULATORY_CARE_PROVIDER_SITE_OTHER): Payer: BLUE CROSS/BLUE SHIELD | Admitting: Orthopedic Surgery

## 2017-07-01 ENCOUNTER — Encounter (INDEPENDENT_AMBULATORY_CARE_PROVIDER_SITE_OTHER): Payer: Self-pay | Admitting: Orthopedic Surgery

## 2017-07-01 DIAGNOSIS — Z96651 Presence of right artificial knee joint: Secondary | ICD-10-CM

## 2017-07-01 MED ORDER — METHOCARBAMOL 500 MG PO TABS: ORAL_TABLET | ORAL | 0 refills | Status: DC

## 2017-07-01 MED ORDER — OXYCODONE-ACETAMINOPHEN 5-325 MG PO TABS: 1.0000 | ORAL_TABLET | Freq: Four times a day (QID) | ORAL | 0 refills | Status: DC | PRN

## 2017-07-01 NOTE — Progress Notes (Signed)
   Post-Op Visit Note   Patient: Margaret Staggs           Date of Birth: May 18, 1956           MRN: [id number] Visit Date: 07/01/2017 PCP: Jarome Matin, MD  Chief Complaint: No chief complaint on file.   HPI:  HPI Patient is a 61 year old gentleman seen today for evaluation of right total knee arthroplasty on 06/12/17. Has progressed well with home PT. Using a cane at home. Continues to have issues with swelling. Feels the compression stockings make swelling and pain worse.  Ortho Exam Incision is well healed. Continues to have moderate swelling. Extension near full. Flexion to 90, active.   Visit Diagnoses:  1. Total knee replacement status, right     Plan: will refer to [facility] PT per his request. Stressed the importance of aggressive PT. Refilled his medications. Will follow up in office in 4 weeks.  Follow-Up Instructions: Return in about 4 weeks (around 07/29/2017).   Imaging: No results found.  Orders:  No orders of the defined types were placed in this encounter.  No orders of the defined types were placed in this encounter.    PMFS History: Patient Active Problem List   Diagnosis Date Noted  . Acute pain of right knee 06/24/2017  . Total knee replacement status, right 06/12/2017  . Unilateral primary osteoarthritis, right knee   . Bilateral primary osteoarthritis of knee 04/11/2017  . Concussion 12/26/2016   Past Medical History:  Diagnosis Date  . Arthritis    "knees, wrists, knuckles" (06/12/2017)  . Concussion 12/2016   "hit head on shelf"  . High cholesterol   . History of kidney stones   . Hypertension     No family history on file.  Past Surgical History:  Procedure Laterality Date  . APPENDECTOMY    . CARDIAC CATHETERIZATION    . KNEE ARTHROSCOPY W/ MENISCAL REPAIR Left   . TOTAL KNEE ARTHROPLASTY Right 06/12/2017  . TOTAL KNEE ARTHROPLASTY Right 06/12/2017   Procedure: RIGHT TOTAL KNEE ARTHROPLASTY;  Surgeon: [name], MD;   Location: [facility] OR;  Service: Orthopedics;  Laterality: Right;  . WRIST ARTHROSCOPY Right    "separated wrist"   Social History   Occupational History  . Not on file.   Social History Main Topics  . Smoking status: Never Smoker  . Smokeless tobacco: Never Used  . Alcohol use 2.4 oz/week    4 Shots of liquor per week  . Drug use: No  . Sexual activity: Not on file

## 2017-07-02 ENCOUNTER — Ambulatory Visit: Payer: BLUE CROSS/BLUE SHIELD | Attending: Orthopedic Surgery | Admitting: Physical Therapy

## 2017-07-02 ENCOUNTER — Encounter: Payer: Self-pay | Admitting: Physical Therapy

## 2017-07-02 DIAGNOSIS — M25561 Pain in right knee: Secondary | ICD-10-CM | POA: Insufficient documentation

## 2017-07-02 DIAGNOSIS — R2241 Localized swelling, mass and lump, right lower limb: Secondary | ICD-10-CM | POA: Insufficient documentation

## 2017-07-02 DIAGNOSIS — M25661 Stiffness of right knee, not elsewhere classified: Secondary | ICD-10-CM | POA: Insufficient documentation

## 2017-07-02 DIAGNOSIS — R262 Difficulty in walking, not elsewhere classified: Secondary | ICD-10-CM

## 2017-07-02 NOTE — Therapy (Signed)
[facility] [place] [address] [place], [place], [address] Phone: [phone]   Fax:  [phone]  Physical Therapy Evaluation  Patient Details  Name: [name] Carroll MRN: [id number] Date of Birth: [birth year] Referring Provider: Lajoyce Corners  Encounter Date: 07/02/2017      PT End of Session - 07/02/17 0907    Visit Number 1   Date for PT Re-Evaluation 09/01/17   PT Start Time 0845   PT Stop Time 0938   PT Time Calculation (min) 53 min   Activity Tolerance Patient tolerated treatment well   Behavior During Therapy [facility] for tasks assessed/performed      Past Medical History:  Diagnosis Date  . Arthritis    "knees, wrists, knuckles" (06/12/2017)  . Concussion 12/2016   "hit head on shelf"  . High cholesterol   . History of kidney stones   . Hypertension     Past Surgical History:  Procedure Laterality Date  . APPENDECTOMY    . CARDIAC CATHETERIZATION    . KNEE ARTHROSCOPY W/ MENISCAL REPAIR Left   . TOTAL KNEE ARTHROPLASTY Right 06/12/2017  . TOTAL KNEE ARTHROPLASTY Right 06/12/2017   Procedure: RIGHT TOTAL KNEE ARTHROPLASTY;  Surgeon: [name], MD;  Location: [facility] OR;  Service: Orthopedics;  Laterality: Right;  . WRIST ARTHROSCOPY Right    "separated wrist"    There were no vitals filed for this visit.       Subjective Assessment - 07/02/17 0844    Subjective Patient reports that he has had knee pain for many years, he underwent a right TKR on 06/12/17.  Reports that he had home health until last week.  Reports knee pain and ankle pain and swelling   Limitations Standing;Walking;House hold activities   Patient Stated Goals have less pain, walk better   Currently in Pain? Yes   Pain Score 7    Pain Location Knee   Pain Orientation Right   Pain Descriptors / Indicators Aching;Sore;Tightness   Pain Type Acute pain;Surgical pain   Pain Onset 1 to 4 weeks ago   Pain Frequency Constant   Aggravating Factors  up on  it, walking, bending, pain up to 8/10, reports more right ankle pain than knee pain today, reports all the swelling in the leg is in the ankle and it causes it to hurt   Pain Relieving Factors ice, rest, pain med at best pain a 2/10            [facility] PT Assessment - 07/02/17 0001      Assessment   Medical Diagnosis s/p right TKR   Referring Provider Lajoyce Corners   Onset Date/Surgical Date 06/12/17   Prior Therapy at home     Precautions   Precautions None     Balance Screen   Has the patient fallen in the past 6 months No   Has the patient had a decrease in activity level because of a fear of falling?  No   Is the patient reluctant to leave their home because of a fear of falling?  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   Leisure no exercise     Observation/Other Assessments-Edema    Edema Circumferential     Circumferential Edema   Circumferential -  Right 56 cm mid patella   Circumferential - Left  49.5 cm     ROM / Strength   AROM / PROM / Strength AROM;PROM;Strength     AROM   AROM Assessment Site Knee   Right/Left Knee Right   Right Knee Extension 40   Right Knee Flexion 81     PROM   PROM Assessment Site Knee   Right/Left Knee Right   Right Knee Extension 12   Right Knee Flexion 88     Strength   Overall Strength Comments 4-/5 in the available ROM     Palpation   Palpation comment proximal scar with a lot of puckering, pitting edema along the shin and in the ankle     Ambulation/Gait   Gait Comments uses a FWW, slow, antalgic ont he right     Standardized Balance Assessment   Standardized Balance Assessment Timed Up and Go Test     Timed Up and Go Test   Normal TUG (seconds) 19            Objective measurements completed on examination: See above findings.           [facility] Adult PT Treatment/Exercise - 07/02/17 0001      Exercises   Exercises Knee/Hip     Knee/Hip Exercises: Aerobic   Nustep level 2 x 5 minutes     Modalities   Modalities Vasopneumatic     Vasopneumatic   Number Minutes Vasopneumatic  15 minutes   Vasopnuematic Location  Knee   Vasopneumatic Pressure Medium   Vasopneumatic Temperature  34                PT Education - 07/02/17 0907    Education provided Yes   Education Details Reviewed his HEP from HHPT, asked him to continue, went over RICE and advised to really elevate   Person(s) Educated Patient   Methods Explanation;Demonstration   Comprehension Verbalized understanding          PT Short Term Goals - 07/02/17 0910      PT SHORT TERM GOAL #1   Title independent with HEP   Time 1   Period Weeks   Status New           PT Long Term Goals - 07/02/17 0910      PT LONG TERM GOAL #1   Title decrease pain 50%   Time 8   Period Weeks   Status New     PT LONG TERM GOAL #2   Title increase AROM of the right knee to 10-110 degrees flexion   Time 8   Period Weeks   Status New     PT LONG TERM GOAL #3   Title walk all community distances without device   Time 8   Period Weeks   Status New     PT LONG TERM GOAL #4   Title independent with RICE   Time 8   Period Weeks   Status New     PT LONG TERM GOAL #5   Title go step over step on stairs   Time 8   Period Weeks   Status New                Plan - 07/02/17 0908    Clinical Impression Statement Patient reports many years of knee pain.  He underwent a right TKR on 06/12/17.  His AROM is 40-81 degrees flexion, swelling limits ROM and causes pain in the ankle,  7 cm larger on the right than the left due to swelling with circumferential measurements.  Uses a FWW for gait, TUG time was 19 seconds   Clinical Presentation Evolving   Clinical Presentation due to: a lot of swelling and recent surgery   Clinical Decision  Making Low   Rehab Potential Good   PT Frequency 2x / week   PT Duration 8 weeks   PT Treatment/Interventions ADLs/Self Care Home Management;Cryotherapy;Electrical Stimulation;Functional mobility training;Stair training;Gait training;Therapeutic activities;Therapeutic exercise;Balance training;Neuromuscular re-education;Patient/family education;Manual techniques;Vasopneumatic Device   PT Next Visit Plan add exercises as tolerated, continue to educate about RICE due to significant swelling   Consulted and Agree with Plan of Care Patient      Patient will benefit from skilled therapeutic intervention in order to improve the following deficits and impairments:  Abnormal gait, Cardiopulmonary status limiting activity, Decreased activity tolerance, Decreased balance, Decreased mobility, Decreased strength, Increased edema, Pain, Decreased range of motion, Difficulty walking  Visit Diagnosis: Acute pain of right knee - Plan: PT plan of care cert/re-cert  Stiffness of right knee, not elsewhere classified - Plan: PT plan of care cert/re-cert  Difficulty in walking, not elsewhere classified - Plan: PT plan of care cert/re-cert  Localized swelling, mass and lump, right lower limb - Plan: PT plan of care cert/re-cert      G-Codes - 2017/08/01 0915    Functional Assessment Tool Used (Outpatient Only) foto 72% limitation       Problem List Patient Active Problem List   Diagnosis Date Noted  . Acute pain of right knee 06/24/2017  . Total knee replacement status, right 06/12/2017  . Unilateral primary osteoarthritis, right knee   . Bilateral primary osteoarthritis of knee 04/11/2017  . Concussion 12/26/2016    Jearld Lesch., PT 08/01/2017, 9:17 AM  [facility] [place] [address] [place], [place], [address] Phone: [phone]   Fax:  [phone]  Name: [name] Carroll MRN: [id number] Date of Birth: 1956/06/25

## 2017-07-04 ENCOUNTER — Encounter: Payer: Self-pay | Admitting: Physical Therapy

## 2017-07-04 ENCOUNTER — Ambulatory Visit: Payer: BLUE CROSS/BLUE SHIELD | Admitting: Physical Therapy

## 2017-07-04 DIAGNOSIS — R262 Difficulty in walking, not elsewhere classified: Secondary | ICD-10-CM

## 2017-07-04 DIAGNOSIS — M25661 Stiffness of right knee, not elsewhere classified: Secondary | ICD-10-CM

## 2017-07-04 DIAGNOSIS — R2241 Localized swelling, mass and lump, right lower limb: Secondary | ICD-10-CM | POA: Diagnosis not present

## 2017-07-04 DIAGNOSIS — M25561 Pain in right knee: Secondary | ICD-10-CM

## 2017-07-04 NOTE — Therapy (Signed)
[facility] [place] [facility] [address] [place], [place], [address] Phone: [phone]   Fax:  [phone]  Physical Therapy Treatment  Patient Details  Name: [name] ManesRoger Carroll MRN: [id number] Date of Birth: 1956-05-10 Referring Provider: Lajoyce Cornersuda  Encounter Date: 07/04/2017      PT End of Session - 07/04/17 0834    Visit Number 2   PT Start Time 0750   PT Stop Time 0845   PT Time Calculation (min) 55 min   Activity Tolerance Patient tolerated treatment well   Behavior [facility] Therapy [facility] for tasks assessed/performed      Past Medical History:  Diagnosis Date  . Arthritis    "knees, wrists, knuckles" (06/12/2017)  . Concussion 12/2016   "hit head on shelf"  . High cholesterol   . History of kidney stones   . Hypertension     Past Surgical History:  Procedure Laterality Date  . APPENDECTOMY    . CARDIAC CATHETERIZATION    . KNEE ARTHROSCOPY W/ MENISCAL REPAIR Left   . TOTAL KNEE ARTHROPLASTY Right 06/12/2017  . TOTAL KNEE ARTHROPLASTY Right 06/12/2017   Procedure: RIGHT TOTAL KNEE ARTHROPLASTY;  Surgeon: Nadara Mustarduda, Marcus V, MD;  [facility];  Service: Orthopedics;  Laterality: Right;  . WRIST ARTHROSCOPY Right    "separated wrist"    There were no vitals filed for this visit.      Subjective Assessment - 07/04/17 0755    Subjective Pt reports that he did not sleep well last night   Currently in Pain? Yes   Pain Score 6    Pain Location Knee   Pain Orientation Right                         [facility] Adult PT Treatment/Exercise - 07/04/17 0001      Knee/Hip Exercises: Aerobic   Nustep level 3 x 7 minutes     Knee/Hip Exercises: Machines for Strengthening   Total Gym Leg Press 20lb 2x10      Knee/Hip Exercises: Seated   Long Arc Quad Right;2 sets;10 reps;AROM   Other Seated Knee/Hip Exercises quad sets 2x10    Hamstring Curl Right;2 sets;15 reps;Strengthening   Hamstring Limitations Green Tband      Manual Therapy   Manual Therapy Passive ROM   Manual therapy comments Somre PROM take to end range and held   Passive ROM R knee flex and ext                  PT Short Term Goals - 07/02/17 0910      PT SHORT TERM GOAL #1   Title independent with HEP   Time 1   Period Weeks   Status On going, Pt reports that he is not consistent            PT Long Term Goals - 07/02/17 0910      PT LONG TERM GOAL #1   Title decrease pain 50%   Time 8   Period Weeks   Status New     PT LONG TERM GOAL #2   Title increase AROM of the right knee to 10-110 degrees flexion   Time 8   Period Weeks   Status New     PT LONG TERM GOAL #3   Title walk all community distances without device   Time 8   Period Weeks   Status New     PT LONG TERM GOAL #  4   Title independent with RICE   Time 8   Period Weeks   Status New     PT LONG TERM GOAL #5   Title go step over step on stairs   Time 8   Period Weeks   Status New               Plan - 07/04/17 4098    Clinical Impression Statement Pt with pain during MT at end ranges ext > flex. Reports some difficulty with HS curls. Despite complaints pt able to complete all of today's interventions. Increase swelling in R knee note with a ballotable patella.   Rehab Potential Good   PT Frequency 2x / week   PT Duration 8 weeks   PT Treatment/Interventions ADLs/Self Care Home Management;Cryotherapy;Electrical Stimulation;Functional mobility training;Stair training;Gait training;Therapeutic activities;Therapeutic exercise;Balance training;Neuromuscular re-education;Patient/family education;Manual techniques;Vasopneumatic Device   PT Next Visit Plan add exercises as tolerated, continue to educate about RICE due to significant swelling      Patient will benefit from skilled therapeutic intervention in order to improve the following deficits and impairments:  Abnormal gait, Cardiopulmonary status limiting activity, Decreased activity  tolerance, Decreased balance, Decreased mobility, Decreased strength, Increased edema, Pain, Decreased range of motion, Difficulty walking  Visit Diagnosis: Stiffness of right knee, not elsewhere classified  Acute pain of right knee  Difficulty in walking, not elsewhere classified  Localized swelling, mass and lump, right lower limb     Problem List Patient Active Problem List   Diagnosis Date Noted  . Acute pain of right knee 06/24/2017  . Total knee replacement status, right 06/12/2017  . Unilateral primary osteoarthritis, right knee   . Bilateral primary osteoarthritis of knee 04/11/2017  . Concussion 12/26/2016    Grayce Sessions, PTA 07/04/2017, 8:38 AM  [facility] [place] [address] [place], [place], [address] Phone: [phone]   Fax:  [phone]  Name: [name] Carroll MRN: [id number] Date of Birth: 1956-05-21

## 2017-07-07 ENCOUNTER — Other Ambulatory Visit (INDEPENDENT_AMBULATORY_CARE_PROVIDER_SITE_OTHER): Payer: Self-pay | Admitting: Orthopedic Surgery

## 2017-07-09 ENCOUNTER — Encounter: Payer: Self-pay | Admitting: Physical Therapy

## 2017-07-09 ENCOUNTER — Ambulatory Visit: Payer: BLUE CROSS/BLUE SHIELD | Admitting: Physical Therapy

## 2017-07-09 DIAGNOSIS — M25661 Stiffness of right knee, not elsewhere classified: Secondary | ICD-10-CM

## 2017-07-09 DIAGNOSIS — R262 Difficulty in walking, not elsewhere classified: Secondary | ICD-10-CM

## 2017-07-09 DIAGNOSIS — R2241 Localized swelling, mass and lump, right lower limb: Secondary | ICD-10-CM

## 2017-07-09 DIAGNOSIS — M25561 Pain in right knee: Secondary | ICD-10-CM

## 2017-07-09 NOTE — Therapy (Signed)
[facility] [place] [address] [place], [place], [address] Phone: [phone]   Fax:  [phone]  Physical Therapy Treatment  Patient Details  Name: [name] ManesRoger Derner MRN: [id number] Date of Birth: 03-Nov-1955 Referring Provider: Lajoyce Cornersuda  Encounter Date: 07/09/2017      PT End of Session - 07/09/17 0921    Visit Number 3   Date for PT Re-Evaluation 09/01/17   PT Start Time 0843   PT Stop Time 0940   PT Time Calculation (min) 57 min      Past Medical History:  Diagnosis Date  . Arthritis    "knees, wrists, knuckles" (06/12/2017)  . Concussion 12/2016   "hit head on shelf"  . High cholesterol   . History of kidney stones   . Hypertension     Past Surgical History:  Procedure Laterality Date  . APPENDECTOMY    . CARDIAC CATHETERIZATION    . KNEE ARTHROSCOPY W/ MENISCAL REPAIR Left   . TOTAL KNEE ARTHROPLASTY Right 06/12/2017  . TOTAL KNEE ARTHROPLASTY Right 06/12/2017   Procedure: RIGHT TOTAL KNEE ARTHROPLASTY;  Surgeon: Nadara Mustarduda, Marcus V, MD;  [facility];  Service: Orthopedics;  Laterality: Right;  . WRIST ARTHROSCOPY Right    "separated wrist"    There were no vitals filed for this visit.      Subjective Assessment - 07/09/17 0845    Subjective bent over to tie shoe and felt 2 pops- better ROM   Currently in Pain? Yes   Pain Score 4    Pain Location Knee   Pain Orientation Right            [facility] PT Assessment - 07/09/17 0001      AROM   AROM Assessment Site Knee   Right/Left Knee Right   Right Knee Extension 8   Right Knee Flexion 105                     [facility] Adult PT Treatment/Exercise - 07/09/17 0001      Knee/Hip Exercises: Aerobic   Elliptical 2 min I 12 R 3  pain in non surgical knee   Nustep level 4 x 6 minutes     Knee/Hip Exercises: Machines for Strengthening   Cybex Knee Extension RT only 5# 2 sets 10   Cybex Knee Flexion 20# BIL 3 set 10   Total Gym Leg Press 30#  3x10      Knee/Hip Exercises: Standing   Heel Raises 20 reps  black bar   Forward Step Up Right;2 sets;10 reps;Hand Hold: 2;Step Height: 6"     Vasopneumatic   Number Minutes Vasopneumatic  15 minutes   Vasopnuematic Location  Knee   Vasopneumatic Pressure Medium                PT Education - 07/09/17 0847    Education provided Yes   Education Details verb reviewed HHPT HEP and need to do 2-3 times daily and ELEVATE ( pt back to work and NOT elevating)   Person(s) Educated Patient   Methods Explanation   Comprehension Verbalized understanding          PT Short Term Goals - 07/09/17 0923      PT SHORT TERM GOAL #1   Title independent with HEP   Status Achieved           PT Long Term Goals - 07/02/17 0910      PT LONG TERM GOAL #1  Title decrease pain 50%   Time 8   Period Weeks   Status New     PT LONG TERM GOAL #2   Title increase AROM of the right knee to 10-110 degrees flexion   Time 8   Period Weeks   Status New     PT LONG TERM GOAL #3   Title walk all community distances without device   Time 8   Period Weeks   Status New     PT LONG TERM GOAL #4   Title independent with RICE   Time 8   Period Weeks   Status New     PT LONG TERM GOAL #5   Title go step over step on stairs   Time 8   Period Weeks   Status New               Plan - 07/09/17 1610    Clinical Impression Statement excellent increase in ROM. Slow to move but amb with SPC into clinic and without AD in clinic. Tolerated ther ex well but modified some d/t pain in non surgical knee. Reviewed HHPT HEP and stressed importance of elevation.   PT Next Visit Plan progress gait,ROM and strength      Patient will benefit from skilled therapeutic intervention in order to improve the following deficits and impairments:  Abnormal gait, Cardiopulmonary status limiting activity, Decreased activity tolerance, Decreased balance, Decreased mobility, Decreased strength, Increased  edema, Pain, Decreased range of motion, Difficulty walking  Visit Diagnosis: Stiffness of right knee, not elsewhere classified  Acute pain of right knee  Difficulty in walking, not elsewhere classified  Localized swelling, mass and lump, right lower limb     Problem List Patient Active Problem List   Diagnosis Date Noted  . Acute pain of right knee 06/24/2017  . Total knee replacement status, right 06/12/2017  . Unilateral primary osteoarthritis, right knee   . Bilateral primary osteoarthritis of knee 04/11/2017  . Concussion 12/26/2016    Cruz Devilla,ANGIE PTA 07/09/2017, 9:23 AM  [facility] [place] [address] [place], [place], [address] Phone: [phone]   Fax:  [phone]  Name: [name] Carroll MRN: [id number] Date of Birth: 1955-12-21

## 2017-07-10 ENCOUNTER — Ambulatory Visit (INDEPENDENT_AMBULATORY_CARE_PROVIDER_SITE_OTHER): Payer: BLUE CROSS/BLUE SHIELD

## 2017-07-10 ENCOUNTER — Telehealth (INDEPENDENT_AMBULATORY_CARE_PROVIDER_SITE_OTHER): Payer: Self-pay | Admitting: Family

## 2017-07-10 ENCOUNTER — Encounter (INDEPENDENT_AMBULATORY_CARE_PROVIDER_SITE_OTHER): Payer: Self-pay | Admitting: Orthopedic Surgery

## 2017-07-10 ENCOUNTER — Ambulatory Visit (INDEPENDENT_AMBULATORY_CARE_PROVIDER_SITE_OTHER): Payer: BLUE CROSS/BLUE SHIELD | Admitting: Orthopedic Surgery

## 2017-07-10 DIAGNOSIS — Z96651 Presence of right artificial knee joint: Secondary | ICD-10-CM

## 2017-07-10 NOTE — Telephone Encounter (Signed)
Patient called advised he had 2 loud pops in his right knee on the right side. Patient said he had a loud pop in the top of his knee. Patient want to know if he should make an appointment to come into the office. Patient said his knee has been swollen since he left the hospital. The number to contact patient is [phone]

## 2017-07-10 NOTE — Progress Notes (Signed)
   Post-Op Visit Note   Patient: Carroll Carroll           Date of Birth: 06-17-1956           MRN: [id number] Visit Date: 07/10/2017 PCP: Carroll MatinPaterson, [name], MD  Chief Complaint:  Chief Complaint  Patient presents with  . Right Knee - Follow-up, Pain    HPI:  HPI Patient is a 61 year old gentleman seen today for concern of popping in lateral right knee during therapy. Is status post right total knee arthroplasty on 06/12/17. Has progressed well with home PT. Using a cane at home. Has had worse pain and swelling laterally following popping during straight leg raises. Some giving way of right knee since. Does state has had improved flexion since.  Right Knee Exam   Tenderness  The patient is experiencing tenderness in the medial joint line.  Range of Motion  Extension: -10  Flexion: 100   Tests  Varus: negative Valgus: negative  Other  Erythema: absent Scars: present Swelling: severe Other tests: no effusion present     Incision is well healed. Pitting edema today to RLE. Lacks 8 degress of Extension.  Visit Diagnoses:  1. Presence of right artificial knee joint     Plan:Continue with PT. Knee wrapped with 6 inch ace wrap. Recommended ice. Reassurance provided, likely popping releasing of scar tissue.   Follow-Up Instructions: Return for as scheduled.   Imaging: Xr Knee 3 View Right  Result Date: 07/10/2017 Radiographs of right knee show stable alignment of hardware status post total knee arthroplasty. No lateral dislocation of patella in sunrise.   Orders:  Orders Placed This Encounter  Procedures  . XR KNEE 3 VIEW RIGHT   No orders of the defined types were placed in this encounter.    PMFS History: Patient Active Problem List   Diagnosis Date Noted  . Presence of right artificial knee joint 07/10/2017  . Acute pain of right knee 06/24/2017  . Total knee replacement status, right 06/12/2017  . Unilateral primary osteoarthritis, right knee   .  Bilateral primary osteoarthritis of knee 04/11/2017  . Concussion 12/26/2016   Past Medical History:  Diagnosis Date  . Arthritis    "knees, wrists, knuckles" (06/12/2017)  . Concussion 12/2016   "hit head on shelf"  . High cholesterol   . History of kidney stones   . Hypertension     No family history on file.  Past Surgical History:  Procedure Laterality Date  . APPENDECTOMY    . CARDIAC CATHETERIZATION    . KNEE ARTHROSCOPY W/ MENISCAL REPAIR Left   . TOTAL KNEE ARTHROPLASTY Right 06/12/2017  . TOTAL KNEE ARTHROPLASTY Right 06/12/2017   Procedure: RIGHT TOTAL KNEE ARTHROPLASTY;  Surgeon: Nadara Mustarduda, Marcus V, MD;  [facility];  Service: Orthopedics;  Laterality: Right;  . WRIST ARTHROSCOPY Right    "separated wrist"   Social History   Occupational History  . Not on file.   Social History Main Topics  . Smoking status: Never Smoker  . Smokeless tobacco: Never Used  . Alcohol use 2.4 oz/week    4 Shots of liquor per week  . Drug use: No  . Sexual activity: Not on file

## 2017-07-10 NOTE — Telephone Encounter (Signed)
Advised patient to come in this afternoon at 1245.

## 2017-07-11 ENCOUNTER — Ambulatory Visit: Payer: BLUE CROSS/BLUE SHIELD | Admitting: Physical Therapy

## 2017-07-11 ENCOUNTER — Encounter: Payer: Self-pay | Admitting: Physical Therapy

## 2017-07-11 DIAGNOSIS — M25561 Pain in right knee: Secondary | ICD-10-CM

## 2017-07-11 DIAGNOSIS — R2241 Localized swelling, mass and lump, right lower limb: Secondary | ICD-10-CM | POA: Diagnosis not present

## 2017-07-11 DIAGNOSIS — R262 Difficulty in walking, not elsewhere classified: Secondary | ICD-10-CM | POA: Diagnosis not present

## 2017-07-11 DIAGNOSIS — M25661 Stiffness of right knee, not elsewhere classified: Secondary | ICD-10-CM | POA: Diagnosis not present

## 2017-07-11 NOTE — Therapy (Signed)
[facility] [address] [place], [place], [address] Phone: [phone]   Fax:  [phone]  Physical Therapy Treatment  Patient Details  Name: [name] Carroll MRN: [id number] Date of Birth: 1955/09/28 Referring Provider: Lajoyce Corners  Encounter Date: 07/11/2017      PT End of Session - 07/11/17 0913    Visit Number 4   Date for PT Re-Evaluation 09/01/17   PT Start Time 0838   PT Stop Time 0932   PT Time Calculation (min) 54 min      Past Medical History:  Diagnosis Date  . Arthritis    "knees, wrists, knuckles" (06/12/2017)  . Concussion 12/2016   "hit head on shelf"  . High cholesterol   . History of kidney stones   . Hypertension     Past Surgical History:  Procedure Laterality Date  . APPENDECTOMY    . CARDIAC CATHETERIZATION    . KNEE ARTHROSCOPY W/ MENISCAL REPAIR Left   . TOTAL KNEE ARTHROPLASTY Right 06/12/2017  . TOTAL KNEE ARTHROPLASTY Right 06/12/2017   Procedure: RIGHT TOTAL KNEE ARTHROPLASTY;  Surgeon: [name], MD;  Location: [facility] OR;  Service: Orthopedics;  Laterality: Right;  . WRIST ARTHROSCOPY Right    "separated wrist"    There were no vitals filed for this visit.      Subjective Assessment - 07/11/17 0838    Subjective saw MD yesterday and felt scar tissue bri=oek up from "pop" tuesday.  Increased swelling. Slept in bed all night   Currently in Pain? Yes   Pain Score 5    Pain Location Knee   Pain Orientation Right                         [facility] Adult PT Treatment/Exercise - 07/11/17 0001      Knee/Hip Exercises: Aerobic   Recumbent Bike 6 min rocking for ROM   Nustep level 4 x 6 minutes     Knee/Hip Exercises: Machines for Strengthening   Cybex Knee Extension RT only 5# 2 sets 10 and 1 set 5   Cybex Knee Flexion 20# BIL 3 set 10   Total Gym Leg Press 30# 3x10   calf raises 30# 2 sets 15     Knee/Hip Exercises: Standing   Heel Raises 15 reps  heel toe on  airex   Hip Flexion Right;Stengthening;15 reps;Knee bent  on airex and HS curl   Hip Flexion Limitations 3#     Modalities   Modalities Vasopneumatic     Vasopneumatic   Number Minutes Vasopneumatic  15 minutes   Vasopnuematic Location  Knee   Vasopneumatic Pressure Medium                PT Education - 07/11/17 0841    Education provided Yes   Education Details again stressed importance of elevation to reduce swelling    Person(s) Educated Patient   Methods Explanation   Comprehension Verbalized understanding          PT Short Term Goals - 07/09/17 0923      PT SHORT TERM GOAL #1   Title independent with HEP   Status Achieved           PT Long Term Goals - 07/02/17 0910      PT LONG TERM GOAL #1   Title decrease pain 50%   Time 8   Period Weeks   Status New     PT  LONG TERM GOAL #2   Title increase AROM of the right knee to 10-110 degrees flexion   Time 8   Period Weeks   Status New     PT LONG TERM GOAL #3   Title walk all community distances without device   Time 8   Period Weeks   Status New     PT LONG TERM GOAL #4   Title independent with RICE   Time 8   Period Weeks   Status New     PT LONG TERM GOAL #5   Title go step over step on stairs   Time 8   Period Weeks   Status New               Plan - 07/11/17 0914    Clinical Impression Statement pt pain limited and slow today, verb d/t swelling, again verb importance of elevation for swelling and sitting at desk all day is not helping. Slow antalgic gait with and without SPC.   PT Next Visit Plan progress gait,ROM and strength      Patient will benefit from skilled therapeutic intervention in order to improve the following deficits and impairments:  Abnormal gait, Cardiopulmonary status limiting activity, Decreased activity tolerance, Decreased balance, Decreased mobility, Decreased strength, Increased edema, Pain, Decreased range of motion, Difficulty walking  Visit  Diagnosis: Stiffness of right knee, not elsewhere classified  Acute pain of right knee  Difficulty in walking, not elsewhere classified  Localized swelling, mass and lump, right lower limb     Problem List Patient Active Problem List   Diagnosis Date Noted  . Presence of right artificial knee joint 07/10/2017  . Acute pain of right knee 06/24/2017  . Total knee replacement status, right 06/12/2017  . Unilateral primary osteoarthritis, right knee   . Bilateral primary osteoarthritis of knee 04/11/2017  . Concussion 12/26/2016    Elijah Phommachanh,ANGIE PTA 07/11/2017, 9:16 AM  [facility] [address] [place], [place], [address] Phone: [phone]   Fax:  [phone]  Name: [name] Carroll MRN: [id number] Date of Birth: 04-14-1956

## 2017-07-16 ENCOUNTER — Ambulatory Visit: Payer: BLUE CROSS/BLUE SHIELD | Admitting: Physical Therapy

## 2017-07-16 ENCOUNTER — Encounter: Payer: Self-pay | Admitting: Physical Therapy

## 2017-07-16 DIAGNOSIS — R262 Difficulty in walking, not elsewhere classified: Secondary | ICD-10-CM | POA: Diagnosis not present

## 2017-07-16 DIAGNOSIS — M25561 Pain in right knee: Secondary | ICD-10-CM | POA: Diagnosis not present

## 2017-07-16 DIAGNOSIS — M25661 Stiffness of right knee, not elsewhere classified: Secondary | ICD-10-CM

## 2017-07-16 DIAGNOSIS — R2241 Localized swelling, mass and lump, right lower limb: Secondary | ICD-10-CM | POA: Diagnosis not present

## 2017-07-16 NOTE — Therapy (Signed)
[place] [address] [place], [place], [address] Phone: [phone]   Fax:  [phone]  Physical Therapy Treatment  Patient Details  Name: [name] Carroll MRN: [id number] Date of Birth: 21-Nov-1955 Referring Provider: Sharol Given  Encounter Date: 07/16/2017      PT End of Session - 07/16/17 0844    Visit Number 5   Date for PT Re-Evaluation 09/01/17   PT Start Time 2878   PT Stop Time 0859   PT Time Calculation (min) 64 min   Activity Tolerance Patient tolerated treatment well   Behavior During Therapy [facility] for tasks assessed/performed      Past Medical History:  Diagnosis Date  . Arthritis    "knees, wrists, knuckles" (06/12/2017)  . Concussion 12/2016   "hit head on shelf"  . High cholesterol   . History of kidney stones   . Hypertension     Past Surgical History:  Procedure Laterality Date  . APPENDECTOMY    . CARDIAC CATHETERIZATION    . KNEE ARTHROSCOPY W/ MENISCAL REPAIR Left   . TOTAL KNEE ARTHROPLASTY Right 06/12/2017  . TOTAL KNEE ARTHROPLASTY Right 06/12/2017   Procedure: RIGHT TOTAL KNEE ARTHROPLASTY;  Surgeon: Newt Minion, MD;  Location: [place];  Service: Orthopedics;  Laterality: Right;  . WRIST ARTHROSCOPY Right    "separated wrist"    There were no vitals filed for this visit.      Subjective Assessment - 07/16/17 0753    Subjective "I did not sleep much last night"   How long can you stand comfortably? ""   Currently in Pain? Yes   Pain Score 3    Pain Location Knee   Pain Orientation Right   Pain Descriptors / Indicators Sharp            [facility] PT Assessment - 07/16/17 0001      AROM   AROM Assessment Site Knee   Right/Left Knee Right   Right Knee Extension 6   Right Knee Flexion 100                     [facility] Adult PT Treatment/Exercise - 07/16/17 0001      Knee/Hip Exercises: Aerobic   Recumbent Bike x4 min seat 9 full revs   Nustep level 4 x 6 minutes      Knee/Hip Exercises: Machines for Strengthening   Cybex Knee Extension RT only 5lb 2x10   Cybex Knee Flexion 25# BIL 2 set 15   Total Gym Leg Press 30# 3x10 ; Heel raises 30lb 2x15      Knee/Hip Exercises: Standing   Hip Flexion Right;Stengthening;15 reps;Knee bent;2 sets   Hip Flexion Limitations 3#   Lateral Step Up 1 set;Right;10 reps;Hand Hold: 0;Step Height: 4"   Forward Step Up Right;2 sets;10 reps;Step Height: 6";Hand Hold: 0;Both     Modalities   Modalities Vasopneumatic     Vasopneumatic   Number Minutes Vasopneumatic  15 minutes   Vasopnuematic Location  Knee   Vasopneumatic Pressure Medium   Vasopneumatic Temperature  34                  PT Short Term Goals - 07/09/17 0923      PT SHORT TERM GOAL #1   Title independent with HEP   Status Achieved           PT Long Term Goals - 07/16/17 0850      PT LONG TERM GOAL #  1   Title decrease pain 50%   Status On-going     PT LONG TERM GOAL #2   Title increase AROM of the right knee to 10-110 degrees flexion   Status Partially Met     PT LONG TERM GOAL #3   Title walk all community distances without device   Status On-going     PT LONG TERM GOAL #4   Title independent with RICE   Status On-going               Plan - 07/16/17 0846    Clinical Impression Statement Pt again given the importance of keeping ice on his knee. Pt reports that he is unable to put ice on his knee yesterday because he had to work. Pt able to complete step ups with little compensation. Pt reports some pain in with leg extension from pad putting pressure on shin.  Pt fatigues quick with standing hip flexion.    Rehab Potential Good   PT Frequency 2x / week   PT Duration 8 weeks   PT Treatment/Interventions ADLs/Self Care Home Management;Cryotherapy;Electrical Stimulation;Functional mobility training;Stair training;Gait training;Therapeutic activities;Therapeutic exercise;Balance training;Neuromuscular  re-education;Patient/family education;Manual techniques;Vasopneumatic Device   PT Next Visit Plan progress gait,ROM and strength      Patient will benefit from skilled therapeutic intervention in order to improve the following deficits and impairments:  Abnormal gait, Cardiopulmonary status limiting activity, Decreased activity tolerance, Decreased balance, Decreased mobility, Decreased strength, Increased edema, Pain, Decreased range of motion, Difficulty walking  Visit Diagnosis: Acute pain of right knee  Stiffness of right knee, not elsewhere classified  Difficulty in walking, not elsewhere classified  Localized swelling, mass and lump, right lower limb     Problem List Patient Active Problem List   Diagnosis Date Noted  . Presence of right artificial knee joint 07/10/2017  . Acute pain of right knee 06/24/2017  . Total knee replacement status, right 06/12/2017  . Unilateral primary osteoarthritis, right knee   . Bilateral primary osteoarthritis of knee 04/11/2017  . Concussion 12/26/2016    Scot Jun, PTA 07/16/2017, 8:50 AM  [place] [address] [place], [place], [address] Phone: [phone]   Fax:  [phone]  Name: Kennett Symes MRN: [id number] Date of Birth: 04/28/1956

## 2017-07-18 ENCOUNTER — Encounter: Payer: Self-pay | Admitting: Physical Therapy

## 2017-07-18 ENCOUNTER — Ambulatory Visit: Payer: BLUE CROSS/BLUE SHIELD | Attending: Orthopedic Surgery | Admitting: Physical Therapy

## 2017-07-18 ENCOUNTER — Ambulatory Visit: Payer: BLUE CROSS/BLUE SHIELD | Admitting: Physical Therapy

## 2017-07-18 DIAGNOSIS — R2241 Localized swelling, mass and lump, right lower limb: Secondary | ICD-10-CM | POA: Insufficient documentation

## 2017-07-18 DIAGNOSIS — M25661 Stiffness of right knee, not elsewhere classified: Secondary | ICD-10-CM | POA: Insufficient documentation

## 2017-07-18 DIAGNOSIS — M25561 Pain in right knee: Secondary | ICD-10-CM | POA: Insufficient documentation

## 2017-07-18 DIAGNOSIS — R262 Difficulty in walking, not elsewhere classified: Secondary | ICD-10-CM | POA: Diagnosis not present

## 2017-07-18 NOTE — Therapy (Signed)
[place] [address] [place], [place], [address] Phone: [phone]   Fax:  [phone]  Physical Therapy Treatment  Patient Details  Name: [name] Carroll MRN: [id number] Date of Birth: 1956-03-14 Referring Provider: Sharol Given  Encounter Date: 07/18/2017      PT End of Session - 07/18/17 0840    Visit Number 6   Date for PT Re-Evaluation 09/01/17   PT Start Time 5035   PT Stop Time 0854   PT Time Calculation (min) 59 min   Activity Tolerance Patient tolerated treatment well   Behavior During Therapy [facility] for tasks assessed/performed      Past Medical History:  Diagnosis Date  . Arthritis    "knees, wrists, knuckles" (06/12/2017)  . Concussion 12/2016   "hit head on shelf"  . High cholesterol   . History of kidney stones   . Hypertension     Past Surgical History:  Procedure Laterality Date  . APPENDECTOMY    . CARDIAC CATHETERIZATION    . KNEE ARTHROSCOPY W/ MENISCAL REPAIR Left   . TOTAL KNEE ARTHROPLASTY Right 06/12/2017  . TOTAL KNEE ARTHROPLASTY Right 06/12/2017   Procedure: RIGHT TOTAL KNEE ARTHROPLASTY;  Surgeon: Newt Minion, MD;  Location: [place];  Service: Orthopedics;  Laterality: Right;  . WRIST ARTHROSCOPY Right    "separated wrist"    There were no vitals filed for this visit.      Subjective Assessment - 07/18/17 0759    Subjective "Pretty good" pt reports not getting a lot of sleep last night   Currently in Pain? No/denies   Pain Score 0-No pain   Pain Location --  upper thigh   Pain Orientation Right   Pain Descriptors / Indicators --  tightness                         [facility] Adult PT Treatment/Exercise - 07/18/17 0001      Ambulation/Gait   Stairs Yes   Stairs Assistance 6: Modified independent (Device/Increase time)   Stair Management Technique One rail Right;Alternating pattern   Number of Stairs 12   Height of Stairs 6   Gait Comments 2 flights,reprots  some knee and back pain      Exercises   Exercises Knee/Hip     Knee/Hip Exercises: Aerobic   Recumbent Bike x4 min seat 9 full revs   Nustep level 4 x 6 minutes LE only      Knee/Hip Exercises: Machines for Strengthening   Cybex Knee Extension RT only 5lb 3x10   Cybex Knee Flexion 35# BIL 3 set 10     Knee/Hip Exercises: Standing   Step Down Right;10 reps;Hand Hold: 1;Hand Hold: 2;Step Height: 4";2 sets  some R knee pain      Knee/Hip Exercises: Seated   Sit to Sand 2 sets;10 reps;without UE support  fom blue chair      Modalities   Modalities Vasopneumatic     Vasopneumatic   Number Minutes Vasopneumatic  15 minutes   Vasopnuematic Location  Knee   Vasopneumatic Pressure Medium   Vasopneumatic Temperature  34                  PT Short Term Goals - 07/09/17 4656      PT SHORT TERM GOAL #1   Title independent with HEP   Status Achieved           PT Long Term Goals -  07/16/17 0850      PT LONG TERM GOAL #1   Title decrease pain 50%   Status On-going     PT LONG TERM GOAL #2   Title increase AROM of the right knee to 10-110 degrees flexion   Status Partially Met     PT LONG TERM GOAL #3   Title walk all community distances without device   Status On-going     PT LONG TERM GOAL #4   Title independent with RICE   Status On-going               Plan - 07/18/17 0841    Clinical Impression Statement Progressed to stair negotiation with some tolerable R knee pain. Pt also reports some knee low back pain when climbing stairs. Pt fatigues quick with functional interventions. Sit it stand focusing on keeping toes even.   Rehab Potential Good   PT Frequency 2x / week   PT Duration 8 weeks   PT Treatment/Interventions ADLs/Self Care Home Management;Cryotherapy;Electrical Stimulation;Functional mobility training;Stair training;Gait training;Therapeutic activities;Therapeutic exercise;Balance training;Neuromuscular re-education;Patient/family  education;Manual techniques;Vasopneumatic Device   PT Next Visit Plan progress gait,ROM and strength      Patient will benefit from skilled therapeutic intervention in order to improve the following deficits and impairments:  Abnormal gait, Cardiopulmonary status limiting activity, Decreased activity tolerance, Decreased balance, Decreased mobility, Decreased strength, Increased edema, Pain, Decreased range of motion, Difficulty walking  Visit Diagnosis: Acute pain of right knee  Stiffness of right knee, not elsewhere classified  Difficulty in walking, not elsewhere classified  Localized swelling, mass and lump, right lower limb     Problem List Patient Active Problem List   Diagnosis Date Noted  . Presence of right artificial knee joint 07/10/2017  . Acute pain of right knee 06/24/2017  . Total knee replacement status, right 06/12/2017  . Unilateral primary osteoarthritis, right knee   . Bilateral primary osteoarthritis of knee 04/11/2017  . Concussion 12/26/2016    Scot Jun, PTA 07/18/2017, 8:44 AM  [place] [address] [place], [place], [address] Phone: [phone]   Fax:  [phone]  Name: Estanislado Surgeon MRN: [id number] Date of Birth: [birth year]

## 2017-07-23 ENCOUNTER — Ambulatory Visit: Payer: BLUE CROSS/BLUE SHIELD | Admitting: Physical Therapy

## 2017-07-25 ENCOUNTER — Encounter: Payer: Self-pay | Admitting: Physical Therapy

## 2017-07-25 ENCOUNTER — Ambulatory Visit: Payer: BLUE CROSS/BLUE SHIELD | Admitting: Physical Therapy

## 2017-07-25 DIAGNOSIS — R262 Difficulty in walking, not elsewhere classified: Secondary | ICD-10-CM | POA: Diagnosis not present

## 2017-07-25 DIAGNOSIS — M25661 Stiffness of right knee, not elsewhere classified: Secondary | ICD-10-CM | POA: Diagnosis not present

## 2017-07-25 DIAGNOSIS — M25561 Pain in right knee: Secondary | ICD-10-CM | POA: Diagnosis not present

## 2017-07-25 DIAGNOSIS — R2241 Localized swelling, mass and lump, right lower limb: Secondary | ICD-10-CM

## 2017-07-25 NOTE — Therapy (Signed)
[address] [place], [place], [address] Phone: [phone]   Fax:  [phone]  Physical Therapy Treatment  Patient Details  Name: [name] Carroll MRN: [id number] Date of Birth: December 23, 1955 Referring Provider: Sharol Given   Encounter Date: 07/25/2017  PT End of Session - 07/25/17 0925    Visit Number  7    Date for PT Re-Evaluation  09/01/17    PT Start Time  0845    PT Stop Time  0940    PT Time Calculation (min)  55 min    Activity Tolerance  Patient tolerated treatment well    Behavior During Therapy  [facility] for tasks assessed/performed       Past Medical History:  Diagnosis Date  . Arthritis    "knees, wrists, knuckles" (06/12/2017)  . Concussion 12/2016   "hit head on shelf"  . High cholesterol   . History of kidney stones   . Hypertension     Past Surgical History:  Procedure Laterality Date  . APPENDECTOMY    . CARDIAC CATHETERIZATION    . KNEE ARTHROSCOPY W/ MENISCAL REPAIR Left   . TOTAL KNEE ARTHROPLASTY Right 06/12/2017  . WRIST ARTHROSCOPY Right    "separated wrist"    There were no vitals filed for this visit.  Subjective Assessment - 07/25/17 0846    Subjective  "A little bit of pain the knee itself, the biggest issues is the shin right about the ankle."    Currently in Pain?  Yes    Pain Score  1     Pain Location  Knee    Pain Orientation  Right         [facility] PT Assessment - 07/25/17 0001      AROM   AROM Assessment Site  Knee    Right/Left Knee  Right    Right Knee Extension  5    Right Knee Flexion  108                  [facility] Adult PT Treatment/Exercise - 07/25/17 0001      Ambulation/Gait   Stairs  Yes    Stairs Assistance  6: Modified independent (Device/Increase time)    Stair Management Technique  One rail Right;Alternating pattern    Number of Stairs  12    Height of Stairs  6    Gait Comments  1 flisht, some pain in L knee      Knee/Hip Exercises:  Aerobic   Nustep  level 4 x 6 minutes LE only       Knee/Hip Exercises: Machines for Strengthening   Cybex Knee Extension  RT only 5lb 3x10    Cybex Knee Flexion  RLE 25lb 2x15    Total Gym Leg Press  40# 3x10 ; Heel raises 40lb 2x15       Knee/Hip Exercises: Standing   Lateral Step Up  Right;10 reps;Hand Hold: 0;Step Height: 6";2 sets    Walking with Sports Cord  50lb 4 way x 3 each      Modalities   Modalities  Vasopneumatic      Vasopneumatic   Number Minutes Vasopneumatic   15 minutes    Vasopnuematic Location   Knee    Vasopneumatic Pressure  Medium    Vasopneumatic Temperature   34               PT Short Term Goals - 07/09/17 3299      PT SHORT  TERM GOAL #1   Title  independent with HEP    Status  Achieved        PT Long Term Goals - 07/16/17 0850      PT LONG TERM GOAL #1   Title  decrease pain 50%    Status  On-going      PT LONG TERM GOAL #2   Title  increase AROM of the right knee to 10-110 degrees flexion    Status  Partially Met      PT LONG TERM GOAL #3   Title  walk all community distances without device    Status  On-going      PT LONG TERM GOAL #4   Title  independent with RICE    Status  On-going            Plan - 07/25/17 0926    Clinical Impression Statement  Pt with a mild increase in R knee AROM. During stair negotiation c/o of pain in non surgical knee. Good strength and ROM with all exercises. Lateral step up with little compensation.    Rehab Potential  Good    PT Frequency  2x / week    PT Duration  8 weeks    PT Treatment/Interventions  ADLs/Self Care Home Management;Cryotherapy;Electrical Stimulation;Functional mobility training;Stair training;Gait training;Therapeutic activities;Therapeutic exercise;Balance training;Neuromuscular re-education;Patient/family education;Manual techniques;Vasopneumatic Device    PT Next Visit Plan  progress gait,ROM and strength       Patient will benefit from skilled therapeutic  intervention in order to improve the following deficits and impairments:  Abnormal gait, Cardiopulmonary status limiting activity, Decreased activity tolerance, Decreased balance, Decreased mobility, Decreased strength, Increased edema, Pain, Decreased range of motion, Difficulty walking  Visit Diagnosis: Acute pain of right knee  Stiffness of right knee, not elsewhere classified  Difficulty in walking, not elsewhere classified  Localized swelling, mass and lump, right lower limb     Problem List Patient Active Problem List   Diagnosis Date Noted  . Presence of right artificial knee joint 07/10/2017  . Acute pain of right knee 06/24/2017  . Total knee replacement status, right 06/12/2017  . Unilateral primary osteoarthritis, right knee   . Bilateral primary osteoarthritis of knee 04/11/2017  . Concussion 12/26/2016    Scot Jun, PTA 07/25/2017, 9:27 AM  [place] [address] [place], [place], [address] Phone: [phone]   Fax:  [phone]  Name: Manan Olmo MRN: [id number] Date of Birth: 05-20-1956

## 2017-07-30 ENCOUNTER — Ambulatory Visit: Payer: BLUE CROSS/BLUE SHIELD | Admitting: Physical Therapy

## 2017-07-30 ENCOUNTER — Encounter: Payer: Self-pay | Admitting: Physical Therapy

## 2017-07-30 DIAGNOSIS — M25661 Stiffness of right knee, not elsewhere classified: Secondary | ICD-10-CM | POA: Diagnosis not present

## 2017-07-30 DIAGNOSIS — R2241 Localized swelling, mass and lump, right lower limb: Secondary | ICD-10-CM | POA: Diagnosis not present

## 2017-07-30 DIAGNOSIS — M25561 Pain in right knee: Secondary | ICD-10-CM | POA: Diagnosis not present

## 2017-07-30 DIAGNOSIS — R262 Difficulty in walking, not elsewhere classified: Secondary | ICD-10-CM | POA: Diagnosis not present

## 2017-07-30 NOTE — Therapy (Addendum)
[address] [place], [place], [address] Phone: [phone]   Fax:  [phone]  Physical Therapy Treatment  Patient Details  Name: [name] Carroll MRN: [id number] Date of Birth: 27-Feb-1956 Referring Provider: Sharol Given   Encounter Date: 07/30/2017  PT End of Session - 07/30/17 0843    Visit Number  8    Date for PT Re-Evaluation  09/01/17    PT Start Time  0800    PT Stop Time  0858    PT Time Calculation (min)  58 min    Activity Tolerance  Patient tolerated treatment well    Behavior During Therapy  [facility] for tasks assessed/performed       Past Medical History:  Diagnosis Date  . Arthritis    "knees, wrists, knuckles" (06/12/2017)  . Concussion 12/2016   "hit head on shelf"  . High cholesterol   . History of kidney stones   . Hypertension     Past Surgical History:  Procedure Laterality Date  . APPENDECTOMY    . CARDIAC CATHETERIZATION    . KNEE ARTHROSCOPY W/ MENISCAL REPAIR Left   . TOTAL KNEE ARTHROPLASTY Right 06/12/2017  . WRIST ARTHROSCOPY Right    "separated wrist"    There were no vitals filed for this visit.  Subjective Assessment - 07/30/17 0800    Subjective  "the rain has my L knee hurting"    Currently in Pain?  Yes    Pain Score  1     Pain Location  Knee    Pain Orientation  Right                      [facility] Adult PT Treatment/Exercise - 07/30/17 0001      Knee/Hip Exercises: Aerobic   Recumbent Bike  x4 min seat 9 full revs    Nustep  level 4 x 6 minutes LE only       Knee/Hip Exercises: Machines for Strengthening   Cybex Knee Extension  RT only 5lb 3x10    Cybex Knee Flexion  RLE 25lb 2x15    Total Gym Leg Press  50# 2x10 ; Heel raises 50lb 2x15       Knee/Hip Exercises: Standing   Lateral Step Up  Right;10 reps;Hand Hold: 0;Step Height: 6";2 sets    Step Down  Right;10 reps;Hand Hold: 2;2 sets;Step Height: 6";5 reps      Modalities   Modalities   Vasopneumatic      Vasopneumatic   Number Minutes Vasopneumatic   15 minutes    Vasopnuematic Location   Knee    Vasopneumatic Pressure  Medium    Vasopneumatic Temperature   34               PT Short Term Goals - 07/09/17 0923      PT SHORT TERM GOAL #1   Title  independent with HEP    Status  Achieved        PT Long Term Goals - 07/16/17 0850      PT LONG TERM GOAL #1   Title  decrease pain 50%    Status  On-going      PT LONG TERM GOAL #2   Title  increase AROM of the right knee to 10-110 degrees flexion    Status  Partially Met      PT LONG TERM GOAL #3   Title  walk all community distances without device  Status  On-going      PT LONG TERM GOAL #4   Title  independent with RICE    Status  On-going            Plan - 07/30/17 0843    Clinical Impression Statement  Pt with Increase pain in non surgical knee limites functional activities. All exercises today's completed well, does reports some pain and difficulty with resisted side step to lateral step up when stepping towards the L side. Pt also reports some pain in the arches of his feet with heel raises.     Rehab Potential  Good    PT Frequency  2x / week    PT Duration  8 weeks    PT Treatment/Interventions  ADLs/Self Care Home Management;Cryotherapy;Electrical Stimulation;Functional mobility training;Stair training;Gait training;Therapeutic activities;Therapeutic exercise;Balance training;Neuromuscular re-education;Patient/family education;Manual techniques;Vasopneumatic Device    PT Next Visit Plan  progress gait,ROM and strength       Patient will benefit from skilled therapeutic intervention in order to improve the following deficits and impairments:  Abnormal gait, Cardiopulmonary status limiting activity, Decreased activity tolerance, Decreased balance, Decreased mobility, Decreased strength, Increased edema, Pain, Decreased range of motion, Difficulty walking  Visit Diagnosis: Acute pain  of right knee  Stiffness of right knee, not elsewhere classified  Difficulty in walking, not elsewhere classified  Localized swelling, mass and lump, right lower limb     Problem List Patient Active Problem List   Diagnosis Date Noted  . Presence of right artificial knee joint 07/10/2017  . Acute pain of right knee 06/24/2017  . Total knee replacement status, right 06/12/2017  . Unilateral primary osteoarthritis, right knee   . Bilateral primary osteoarthritis of knee 04/11/2017  . Concussion 12/26/2016   PHYSICAL THERAPY DISCHARGE SUMMARY  Visits from Start of Care: 8 Plan: Patient agrees to discharge.  Patient goals were partially met. Patient is being discharged due to being pleased with the current functional level.  ?????       Ronald G Pemberton, PTA 07/30/2017, 8:49 AM  [facility] [address] [place], [place], [address] Phone: [phone]   Fax:  [phone]  Name: [name] Carroll MRN: [id number] Date of Birth: 12/12/1955   

## 2017-08-01 ENCOUNTER — Ambulatory Visit (INDEPENDENT_AMBULATORY_CARE_PROVIDER_SITE_OTHER): Payer: BLUE CROSS/BLUE SHIELD | Admitting: Orthopedic Surgery

## 2017-08-01 ENCOUNTER — Ambulatory Visit: Payer: BLUE CROSS/BLUE SHIELD | Admitting: Physical Therapy

## 2017-08-01 ENCOUNTER — Encounter (INDEPENDENT_AMBULATORY_CARE_PROVIDER_SITE_OTHER): Payer: Self-pay | Admitting: Orthopedic Surgery

## 2017-08-01 DIAGNOSIS — Z96651 Presence of right artificial knee joint: Secondary | ICD-10-CM

## 2017-08-01 NOTE — Progress Notes (Signed)
Office Visit Note   Patient: Carroll Carroll           Date of Birth: [birth year]           MRN: [id number] Visit Date: 08/01/2017              Requested by: Carroll MatinPaterson, [name], MD [address] [place], [place] [address] PCP: Carroll MatinPaterson, [name], MD  Chief Complaint  Patient presents with  . Right Knee - Follow-up      HPI: Progress 6 weeks status post right total knee arthroplasty.  Patient feels that the therapist is working too hard to work on extension.  Assessment & Plan: Visit Diagnoses:  1. Presence of right artificial knee joint     Plan: Patient states he would like to proceed with therapy on his own he will join a gym he will work on all the exercises including extension exercises.  Recommended continue wearing his compression stockings for his venous stasis swelling.  Follow-Up Instructions: Return in about 4 weeks (around 08/29/2017).   Ortho Exam  Patient is alert, oriented, no adenopathy, well-dressed, normal affect, normal respiratory effort. Examination patient ambulates independently without a cane or walker.  He lacks about 5 degrees to full extension there is no redness no cellulitis he is doing an excellent job with scar massage and the incision is and superficial.  There is no signs of DVT he does have venous stasis swelling.  No pain with dorsiflexion of the ankle.  Imaging: No results found. No images are attached to the encounter.  Labs: No results found for: HGBA1C, ESRSEDRATE, CRP, LABURIC, REPTSTATUS, GRAMSTAIN, CULT, LABORGA  Orders:  No orders of the defined types were placed in this encounter.  No orders of the defined types were placed in this encounter.    Procedures: No procedures performed  Clinical Data: No additional findings.  ROS:  All other systems negative, except as noted in the HPI. Review of Systems  Objective: Vital Signs: There were no vitals taken for this visit.  Specialty Comments:  No specialty comments  available.  PMFS History: Patient Active Problem List   Diagnosis Date Noted  . Presence of right artificial knee joint 07/10/2017  . Acute pain of right knee 06/24/2017  . Total knee replacement status, right 06/12/2017  . Unilateral primary osteoarthritis, right knee   . Bilateral primary osteoarthritis of knee 04/11/2017  . Concussion 12/26/2016   Past Medical History:  Diagnosis Date  . Arthritis    "knees, wrists, knuckles" (06/12/2017)  . Concussion 12/2016   "hit head on shelf"  . High cholesterol   . History of kidney stones   . Hypertension     History reviewed. No pertinent family history.  Past Surgical History:  Procedure Laterality Date  . APPENDECTOMY    . CARDIAC CATHETERIZATION    . KNEE ARTHROSCOPY W/ MENISCAL REPAIR Left   . TOTAL KNEE ARTHROPLASTY Right 06/12/2017  . TOTAL KNEE ARTHROPLASTY Right 06/12/2017   Procedure: RIGHT TOTAL KNEE ARTHROPLASTY;  Surgeon: Nadara Mustarduda, Gayanne Prescott V, MD;  [facility];  Service: Orthopedics;  Laterality: Right;  . WRIST ARTHROSCOPY Right    "separated wrist"   Social History   Occupational History  . Not on file  Tobacco Use  . Smoking status: Never Smoker  . Smokeless tobacco: Never Used  Substance and Sexual Activity  . Alcohol use: Yes    Alcohol/week: 2.4 oz    Types: 4 Shots of liquor per week  . Drug use: No  .  Sexual activity: Not on file

## 2017-08-05 ENCOUNTER — Ambulatory Visit: Payer: BLUE CROSS/BLUE SHIELD | Admitting: Physical Therapy

## 2017-08-11 ENCOUNTER — Other Ambulatory Visit (INDEPENDENT_AMBULATORY_CARE_PROVIDER_SITE_OTHER): Payer: Self-pay | Admitting: Family

## 2017-08-13 ENCOUNTER — Ambulatory Visit: Payer: BLUE CROSS/BLUE SHIELD | Admitting: Physical Therapy

## 2017-08-15 ENCOUNTER — Ambulatory Visit: Payer: BLUE CROSS/BLUE SHIELD | Admitting: Physical Therapy

## 2017-08-29 ENCOUNTER — Ambulatory Visit (INDEPENDENT_AMBULATORY_CARE_PROVIDER_SITE_OTHER): Payer: BLUE CROSS/BLUE SHIELD | Admitting: Orthopedic Surgery

## 2017-09-02 ENCOUNTER — Other Ambulatory Visit (INDEPENDENT_AMBULATORY_CARE_PROVIDER_SITE_OTHER): Payer: Self-pay | Admitting: Orthopedic Surgery

## 2017-09-02 NOTE — Telephone Encounter (Signed)
Patient does not need aspirin for postoperative surgical DVT prophylaxis.  He may take aspirin as needed.

## 2017-09-02 NOTE — Telephone Encounter (Signed)
I called and left vm to advise of message below. Advised he can take OTC aspirin 81mg  prn as needed, but aspirin 325 not needed for dvt prophylaxis anymore.

## 2017-09-05 ENCOUNTER — Ambulatory Visit (INDEPENDENT_AMBULATORY_CARE_PROVIDER_SITE_OTHER): Payer: BLUE CROSS/BLUE SHIELD | Admitting: Orthopedic Surgery

## 2017-09-05 ENCOUNTER — Encounter (INDEPENDENT_AMBULATORY_CARE_PROVIDER_SITE_OTHER): Payer: Self-pay | Admitting: Orthopedic Surgery

## 2017-09-05 DIAGNOSIS — Z96651 Presence of right artificial knee joint: Secondary | ICD-10-CM

## 2017-09-05 NOTE — Progress Notes (Signed)
   Office Visit Note   Patient: Carroll Carroll           Date of Birth: March 04, 1956           MRN: [id number] Visit Date: 09/05/2017              Requested by: Carroll Carroll, [name], MD [address] [place], [place] [address] PCP: Carroll Carroll, [name], MD  Chief Complaint  Patient presents with  . Right Knee - Follow-up      HPI: Patient presents proximally 3 months status post right total knee arthroplasty patient is making excellent progress with strengthening.  Assessment & Plan: Visit Diagnoses:  1. Total knee replacement status, right     Plan: Patient will continue with the strengthening exercises he will follow-up as needed.  Follow-Up Instructions: Return if symptoms worsen or fail to improve.   Ortho Exam  Patient is alert, oriented, no adenopathy, well-dressed, normal affect, normal respiratory effort. Examination patient has a normal gait.  His patella tracks midline.  He has minimal scarring there is no keloiding of the scar he will continue with scar massage.  Imaging: No results found. No images are attached to the encounter.  Labs: No results found for: HGBA1C, ESRSEDRATE, CRP, LABURIC, REPTSTATUS, GRAMSTAIN, CULT, LABORGA  @LABSALLVALUES (HGBA1)@  There is no height or weight on file to calculate BMI.  Orders:  No orders of the defined types were placed in this encounter.  No orders of the defined types were placed in this encounter.    Procedures: No procedures performed  Clinical Data: No additional findings.  ROS:  All other systems negative, except as noted in the HPI. Review of Systems  Objective: Vital Signs: There were no vitals taken for this visit.  Specialty Comments:  No specialty comments available.  PMFS History: Patient Active Problem List   Diagnosis Date Noted  . Presence of right artificial knee joint 07/10/2017  . Acute pain of right knee 06/24/2017  . Total knee replacement status, right 06/12/2017  . Unilateral primary  osteoarthritis, right knee   . Bilateral primary osteoarthritis of knee 04/11/2017  . Concussion 12/26/2016   Past Medical History:  Diagnosis Date  . Arthritis    "knees, wrists, knuckles" (06/12/2017)  . Concussion 12/2016   "hit head on shelf"  . High cholesterol   . History of kidney stones   . Hypertension     History reviewed. No pertinent family history.  Past Surgical History:  Procedure Laterality Date  . APPENDECTOMY    . CARDIAC CATHETERIZATION    . KNEE ARTHROSCOPY W/ MENISCAL REPAIR Left   . TOTAL KNEE ARTHROPLASTY Right 06/12/2017  . TOTAL KNEE ARTHROPLASTY Right 06/12/2017   Procedure: RIGHT TOTAL KNEE ARTHROPLASTY;  Surgeon: [name] Mustarduda, Walther Sanagustin V, MD;  [facility];  Service: Orthopedics;  Laterality: Right;  . WRIST ARTHROSCOPY Right    "separated wrist"   Social History   Occupational History  . Not on file  Tobacco Use  . Smoking status: Never Smoker  . Smokeless tobacco: Never Used  Substance and Sexual Activity  . Alcohol use: Yes    Alcohol/week: 2.4 oz    Types: 4 Shots of liquor per week  . Drug use: No  . Sexual activity: Not on file

## 2017-09-06 DIAGNOSIS — J019 Acute sinusitis, unspecified: Secondary | ICD-10-CM | POA: Diagnosis not present

## 2017-10-16 DIAGNOSIS — I10 Essential (primary) hypertension: Secondary | ICD-10-CM | POA: Diagnosis not present

## 2017-10-16 DIAGNOSIS — Z6841 Body Mass Index (BMI) 40.0 and over, adult: Secondary | ICD-10-CM | POA: Diagnosis not present

## 2017-10-16 DIAGNOSIS — J029 Acute pharyngitis, unspecified: Secondary | ICD-10-CM | POA: Diagnosis not present

## 2017-10-16 DIAGNOSIS — J3089 Other allergic rhinitis: Secondary | ICD-10-CM | POA: Diagnosis not present

## 2017-10-24 ENCOUNTER — Encounter: Payer: Self-pay | Admitting: Allergy

## 2017-10-24 ENCOUNTER — Ambulatory Visit (INDEPENDENT_AMBULATORY_CARE_PROVIDER_SITE_OTHER): Payer: BLUE CROSS/BLUE SHIELD | Admitting: Allergy

## 2017-10-24 VITALS — BP 112/70 | HR 87 | Temp 98.0°F | Resp 19 | Ht 68.0 in | Wt 345.2 lb

## 2017-10-24 DIAGNOSIS — J309 Allergic rhinitis, unspecified: Secondary | ICD-10-CM

## 2017-10-24 DIAGNOSIS — Z91018 Allergy to other foods: Secondary | ICD-10-CM | POA: Diagnosis not present

## 2017-10-24 DIAGNOSIS — J329 Chronic sinusitis, unspecified: Secondary | ICD-10-CM

## 2017-10-24 DIAGNOSIS — H1010 Acute atopic conjunctivitis, unspecified eye: Secondary | ICD-10-CM | POA: Diagnosis not present

## 2017-10-24 MED ORDER — FLUTICASONE PROPIONATE 93 MCG/ACT NA EXHU: 1.0000 | INHALANT_SUSPENSION | Freq: Two times a day (BID) | NASAL | 11 refills | Status: DC

## 2017-10-24 NOTE — Progress Notes (Signed)
New Patient Note  RE: [name] Carroll MRN: [id number] DOB: 02/15/1956 Date of Office Visit: 10/24/2017  Referring provider: Jarome MatinPaterson, [name], MD Primary care provider: Jarome MatinPaterson, [name], MD  Chief Complaint:  allergies  History of present illness: [name] Carroll is a 62 y.o. male presenting today for consultation for allergic rhinoconjunctivitis and recurrent sinus infections.  He presents today with his wife.    He reports he is constantly nasally congested with sinus drainage.  He also reports his ear seem full with the nasal congestion.   Symptoms are year round.   He does feel he is allergic to grass as when he mows the lawn he reports getting eye swelling and "blisters" on the inside of his eyelid.   He has seen an eye doctor for this and reports they noted "white bumps" on the inside of his eyelid and was prescribed an eyedrop that he does not recall the name of but states it is very effective at treating the eye symptoms.  He is currently taking Xyzal daily, Azelastine 2 sprays daily and Flonase 2 sprays daily.  He has tried Claritin in the past.  He has seen allergist in the past and last saw allergist at [facility] who did skin testing on 11/14/16 that only showed positive to grass mix on intradermals.  He states the skin testing was done on antihistamines thus they are not sure if the testing is accurate.  He also saw an allergist when he lived in [place] several years ago and had testing done there that showed grass, weed and tree sensitivities.    He reports getting about 6-7 sinus infections/year.  He is treated with antibiotics typically Amoxicillin.  He normally reports he will have symptoms for several days prior to getting treated.  He does report fevers with infection.  He also feels the left side of his face is more affected as far as congestion and pain.  He reports he has gotten ear infections with his sinus infections.  He has not seen ENT before or had any sinus imaging.  He  denies any skin infections or opportunistic infections.  No family history of immunodeficiency or recurrent infections.    He has no history of asthma or eczema.  He avoids mushrooms as he reports his throat swells and he has trouble breathing. First reaction was in the 70s.  He has an UTD epipen.     Review of systems: Review of Systems  Constitutional: Negative for chills, fever and malaise/fatigue.  HENT: Positive for congestion and ear pain. Negative for ear discharge, hearing loss, nosebleeds, sinus pain, sore throat and tinnitus.   Eyes: Negative for pain, discharge and redness.  Respiratory: Negative for cough, shortness of breath and wheezing.   Cardiovascular: Negative for chest pain.  Gastrointestinal: Negative for abdominal pain, constipation, diarrhea, heartburn, nausea and vomiting.  Musculoskeletal: Negative for joint pain.  Skin: Negative for itching and rash.  Neurological: Negative for headaches.    All other systems negative unless noted above in HPI  Past medical history: Past Medical History:  Diagnosis Date  . Arthritis    "knees, wrists, knuckles" (06/12/2017)  . Concussion 12/2016   "hit head on shelf"  . High cholesterol   . History of kidney stones   . Hypertension     Past surgical history: Past Surgical History:  Procedure Laterality Date  . APPENDECTOMY    . CARDIAC CATHETERIZATION    . KNEE ARTHROSCOPY W/ MENISCAL REPAIR Left   . TOTAL  KNEE ARTHROPLASTY Right 06/12/2017  . TOTAL KNEE ARTHROPLASTY Right 06/12/2017   Procedure: RIGHT TOTAL KNEE ARTHROPLASTY;  Surgeon: [name], MD;  Location[facility] OR;  Service: Orthopedics;  Laterality: Right;  . WRIST ARTHROSCOPY Right    "separated wrist"    Family history:  Family History  Problem Relation Age of Onset  . Allergic rhinitis Mother   . Allergic rhinitis Brother     Social history: He lives with his wife in a home with carpeting with electric heating and central cooling.  There is a  cat in the home.  There is concern for water damage/mildew in home but no concern for roaches.  He works in [place].  He denies a smoking history.     Medication List: Allergies as of 10/24/2017      Reactions   Sulfa Antibiotics Hives      Medication List        Accurate as of 10/24/17 10:03 AM. Always use your most recent med list.          aspirin 325 MG EC tablet TAKE 1 TABLET BY MOUTH EVERY DAY   azelastine 0.1 % nasal spray Commonly known as:  ASTELIN Place 2 sprays into both nostrils at bedtime. Use in each nostril as directed   ezetimibe-simvastatin 10-40 MG tablet Commonly known as:  VYTORIN Take 1 tablet by mouth at bedtime.   famotidine 20 MG tablet Commonly known as:  PEPCID Take 20 mg by mouth at bedtime.   levocetirizine 5 MG tablet Commonly known as:  XYZAL Take 5 mg by mouth at bedtime.   lisinopril 10 MG tablet Commonly known as:  PRINIVIL,ZESTRIL Take 10 mg by mouth at bedtime.   Magnesium 500 MG Caps Take 500 mg by mouth 2 (two) times daily.   multivitamin with minerals Tabs tablet Take 1 tablet by mouth daily.   spironolactone 25 MG tablet Commonly known as:  ALDACTONE Take 25 mg by mouth daily.   verapamil 120 MG tablet Commonly known as:  CALAN Take 120 mg by mouth 2 (two) times daily.   vitamin E 400 UNIT capsule Take 400 Units by mouth daily.       Known medication allergies: Allergies  Allergen Reactions  . Sulfa Antibiotics Hives     Physical examination: Blood pressure 112/70, pulse 87, temperature 98 F (36.7 C), resp. rate 19, height 5\' 8"  (1.727 m), weight (!) 345 lb 3.2 oz (156.6 kg), SpO2 94 %.  General: Alert, interactive, in no acute distress. HEENT: PERRLA, TMs pearly gray, turbinates markedly edematous with clear discharge, post-pharynx non erythematous. Neck: Supple without lymphadenopathy. Lungs: Clear to auscultation without wheezing, rhonchi or rales. {no increased work of breathing. CV: Normal S1, S2  without murmurs. Abdomen: Nondistended, nontender. Skin: Warm and dry, without lesions or rashes. Extremities:  No clubbing, cyanosis or edema. Neuro:   Grossly intact.  Diagnositics/Labs:  Allergy testing: deferred due to skin testing within past year  Assessment and plan:   Allergic rhinoconjunctivitis     - symptoms are year-round and at this time not well controlled with current allergy medication regimen     - will obtain environmental panel by bloodwork.  If this is negative will have you return for a skin testing visit off antihistamines for 3 days after 11/14/17.      - recommend taking Xyzal 5mg  twice a day and see if this improves symptoms     - take Azelastine 2 sprays each nostril twice a day     -  will prescribe Timmothy Sours which is Flonase with a specialized device to help Flonase reach deeper sinuses for better relief of nasal symptoms.   You will need to sign up for monthly autoship for free drug for 1 year.       - further treatment based on testing  Recurrent sinus infections      - significant history of sinus and ear infections 6-7/year.        - will obtain immunocompetence work-up with following labs: CBC w diff, CMP, immunoglobulins and vaccine titers.        -  Consider sinus CT +/- ENT referral is immunocompetence work-up is normal.    Food Allergy   - continue mushroom avoidance.  Will obtain serum IgE level.     - have access to self-injectable epinephrine (Epipen or AuviQ) 0.3mg  at all times    - follow emergency action plan in case of allergic reaction   Follow-up in 3 months or sooner for skin testing   I appreciate the opportunity to take part in Lavi's care. Please do not hesitate to contact me with questions.  Sincerely,   Margo Aye, MD Allergy/Immunology [facility]

## 2017-10-24 NOTE — Patient Instructions (Addendum)
Allergies     - symptoms are year-round and at this time not well controlled with current allergy medication regimen     - will obtain environmental panel by bloodwork.  If this is negative will have you return for a skin testing visit off antihistamines for 3 days.       - recommend taking Xyzal 5mg  twice a day and see if this improves symptoms     - take Azelastine 2 sprays each nostril twice a day     - will prescribe Timmothy SoursXhance which is Flonase with a specialized device to help Flonase reach deeper sinuses for better relief of nasal symptoms.   You will need to sign up for monthly autoship for free drug for 1 year.       - further treatment based on testing  Recurrent sinus infections      - will obtain immunocompetence work-up with following labs: CBC w diff, CMP, immunoglobulins and vaccine titers.    Food Allergy   - continue mushroom avoidance   - have access to self-injectable epinephrine (Epipen or AuviQ) 0.3mg  at all times    - follow emergency action plan in case of allergic reaction   Follow-up in 3 months or sooner for skin testing

## 2017-10-30 LAB — IGE+ALLERGENS ZONE 3(27)
Alternaria Alternata IgE: <0.1 kU/L
Common Silver Birch IgE: <0.1 kU/L
D Farinae IgE: 0.44 kU/L — AB
D Pteronyssinus IgE: <0.1 kU/L
G002-IGE BERMUDA GRASS: 0.77 kU/L — AB
G010-IGE JOHNSON GRASS: 0.12 kU/L — AB
G017-IGE BAHIA GRASS: 0.14 kU/L — AB
IgE (Immunoglobulin E), Serum: 293 IU/mL — ABNORMAL HIGH (ref 0–100)
Kentucky Bluegrass IgE: 0.13 kU/L — AB
Nettle IgE: 0.16 kU/L — AB
Oak, White IgE: <0.1 kU/L
Penicillium Chrysogen IgE: 1.05 kU/L — AB
Pigweed, Rough IgE: <0.1 kU/L
Plantain, English IgE: <0.1 kU/L
Stemphylium Herbarum IgE: 0.1 kU/L — AB

## 2017-10-30 LAB — CBC WITH DIFFERENTIAL/PLATELET
BASOS ABS: 0 10*3/uL (ref 0.0–0.2)
Basos: 0 %
EOS (ABSOLUTE): 0.1 10*3/uL (ref 0.0–0.4)
Eos: 1 %
HEMOGLOBIN: 14.6 g/dL (ref 13.0–17.7)
Hematocrit: 45.1 % (ref 37.5–51.0)
Immature Grans (Abs): 0 10*3/uL (ref 0.0–0.1)
Immature Granulocytes: 0 %
LYMPHS ABS: 1.4 10*3/uL (ref 0.7–3.1)
Lymphs: 24 %
MCH: 26.3 pg — AB (ref 26.6–33.0)
MCHC: 32.4 g/dL (ref 31.5–35.7)
MCV: 81 fL (ref 79–97)
MONOCYTES: 11 %
MONOS ABS: 0.7 10*3/uL (ref 0.1–0.9)
NEUTROS ABS: 3.7 10*3/uL (ref 1.4–7.0)
Neutrophils: 64 %
PLATELETS: 179 10*3/uL (ref 150–379)
RBC: 5.55 x10E6/uL (ref 4.14–5.80)
RDW: 15.9 % — AB (ref 12.3–15.4)
WBC: 5.8 10*3/uL (ref 3.4–10.8)

## 2017-10-30 LAB — STREP PNEUMONIAE 23 SEROTYPES IGG
PNEUMO AB TYPE 20: 0.6 ug/mL — AB (ref >1.3)
PNEUMO AB TYPE 54 (15B): 0.6 ug/mL — AB (ref >1.3)
PNEUMO AB TYPE 57 (19A): 1.8 ug/mL (ref >1.3)
PNEUMO AB TYPE 68 (9V): 1.6 ug/mL (ref >1.3)
PNEUMO AB TYPE 8: 0.4 ug/mL — AB (ref >1.3)
Pneumo Ab Type 1*: 0.3 ug/mL — ABNORMAL LOW (ref >1.3)
Pneumo Ab Type 12 (12F)*: <0.1 ug/mL — ABNORMAL LOW (ref >1.3)
Pneumo Ab Type 14*: 2.3 ug/mL (ref >1.3)
Pneumo Ab Type 17 (17F)*: 2.3 ug/mL (ref >1.3)
Pneumo Ab Type 19 (19F)*: 0.9 ug/mL — ABNORMAL LOW (ref >1.3)
Pneumo Ab Type 2*: <0.1 ug/mL — ABNORMAL LOW (ref >1.3)
Pneumo Ab Type 23 (23F)*: 0.3 ug/mL — ABNORMAL LOW (ref >1.3)
Pneumo Ab Type 26 (6B)*: <0.1 ug/mL — ABNORMAL LOW (ref >1.3)
Pneumo Ab Type 3*: 12.1 ug/mL (ref >1.3)
Pneumo Ab Type 43 (11A)*: <0.1 ug/mL — ABNORMAL LOW (ref >1.3)
Pneumo Ab Type 5*: 3.7 ug/mL (ref >1.3)
Pneumo Ab Type 56 (18C)*: 0.2 ug/mL — ABNORMAL LOW (ref >1.3)
Pneumo Ab Type 70 (33F)*: 0.7 ug/mL — ABNORMAL LOW (ref >1.3)
Pneumo Ab Type 9 (9N)*: 0.5 ug/mL — ABNORMAL LOW (ref >1.3)

## 2017-10-30 LAB — COMPREHENSIVE METABOLIC PANEL
ALK PHOS: 75 IU/L (ref 39–117)
ALT: 21 IU/L (ref 0–44)
AST: 15 IU/L (ref 0–40)
Albumin/Globulin Ratio: 1.7 (ref 1.2–2.2)
Albumin: 4.5 g/dL (ref 3.5–5.5)
BUN / CREAT RATIO: 12 (ref 9–20)
BUN: 14 mg/dL (ref 6–24)
Bilirubin Total: 0.3 mg/dL (ref 0.0–1.2)
CO2: 24 mmol/L (ref 20–29)
CREATININE: 1.15 mg/dL (ref 0.76–1.27)
Calcium: 9.9 mg/dL (ref 8.7–10.2)
Chloride: 98 mmol/L (ref 96–106)
GFR calc Af Amer: 80 mL/min/{1.73_m2} (ref >59)
GFR calc non Af Amer: 69 mL/min/{1.73_m2} (ref >59)
GLUCOSE: 100 mg/dL — AB (ref 65–99)
Globulin, Total: 2.6 g/dL (ref 1.5–4.5)
Potassium: 4.7 mmol/L (ref 3.5–5.2)
Sodium: 137 mmol/L (ref 134–144)
Total Protein: 7.1 g/dL (ref 6.0–8.5)

## 2017-10-30 LAB — IGG, IGA, IGM
IGA/IMMUNOGLOBULIN A, SERUM: 272 mg/dL (ref 90–386)
IGG (IMMUNOGLOBIN G), SERUM: 1200 mg/dL (ref 700–1600)
IgM (Immunoglobulin M), Srm: 81 mg/dL (ref 20–172)

## 2017-10-30 LAB — ALLERGEN, MUSHROOM, RF212

## 2017-10-30 LAB — DIPHTHERIA / TETANUS ANTIBODY PANEL
DIPHTHERIA AB: 0.3 [IU]/mL (ref <0.10)
TETANUS AB, IGG: 1.29 [IU]/mL (ref <0.10)

## 2017-11-04 ENCOUNTER — Other Ambulatory Visit: Payer: Self-pay

## 2017-11-04 ENCOUNTER — Telehealth: Payer: Self-pay | Admitting: Allergy

## 2017-11-04 MED ORDER — PNEUMOCOCCAL VAC POLYVALENT 25 MCG/0.5ML IJ INJ: 0.5000 mL | INJECTION | INTRAMUSCULAR | 0 refills | Status: AC

## 2017-11-04 NOTE — Telephone Encounter (Signed)
Called and left message for patient to call office in regards to pneumonia. RX has been sent in.

## 2017-11-04 NOTE — Telephone Encounter (Signed)
Pt called and said that the pharmacy said he needs a rx for the pneumonia shot. [phone]

## 2017-11-05 NOTE — Telephone Encounter (Signed)
Patient called back in regards to this matter. Patient informed prescription sent in.

## 2017-11-15 ENCOUNTER — Other Ambulatory Visit (INDEPENDENT_AMBULATORY_CARE_PROVIDER_SITE_OTHER): Payer: Self-pay | Admitting: Orthopedic Surgery

## 2017-11-18 NOTE — Telephone Encounter (Signed)
Pt is s/p a right total knee replacement 06/12/17 and is asking for refill please advise.

## 2018-01-16 ENCOUNTER — Encounter: Payer: Self-pay | Admitting: Family Medicine

## 2018-01-16 ENCOUNTER — Ambulatory Visit (INDEPENDENT_AMBULATORY_CARE_PROVIDER_SITE_OTHER): Payer: BLUE CROSS/BLUE SHIELD | Admitting: Family Medicine

## 2018-01-16 VITALS — BP 110/64 | HR 81 | Temp 98.7°F | Ht 68.25 in | Wt 350.0 lb

## 2018-01-16 DIAGNOSIS — M170 Bilateral primary osteoarthritis of knee: Secondary | ICD-10-CM | POA: Diagnosis not present

## 2018-01-16 DIAGNOSIS — J3089 Other allergic rhinitis: Secondary | ICD-10-CM | POA: Diagnosis not present

## 2018-01-16 DIAGNOSIS — Z23 Encounter for immunization: Secondary | ICD-10-CM | POA: Diagnosis not present

## 2018-01-16 DIAGNOSIS — E782 Mixed hyperlipidemia: Secondary | ICD-10-CM | POA: Diagnosis not present

## 2018-01-16 DIAGNOSIS — I10 Essential (primary) hypertension: Secondary | ICD-10-CM | POA: Diagnosis not present

## 2018-01-16 DIAGNOSIS — E785 Hyperlipidemia, unspecified: Secondary | ICD-10-CM | POA: Insufficient documentation

## 2018-01-16 DIAGNOSIS — J309 Allergic rhinitis, unspecified: Secondary | ICD-10-CM | POA: Insufficient documentation

## 2018-01-16 MED ORDER — FAMOTIDINE 20 MG PO TABS
20.0000 mg | ORAL_TABLET | Freq: Every day | ORAL | 2 refills | Status: AC
Start: 2018-01-16 — End: ?

## 2018-01-16 MED ORDER — EZETIMIBE-SIMVASTATIN 10-40 MG PO TABS: 1.0000 | ORAL_TABLET | Freq: Every day | ORAL | 2 refills | Status: DC

## 2018-01-16 MED ORDER — VERAPAMIL HCL 120 MG PO TABS: 120.0000 mg | ORAL_TABLET | Freq: Two times a day (BID) | ORAL | 2 refills | Status: AC

## 2018-01-16 MED ORDER — AZELASTINE HCL 0.1 % NA SOLN: 2.0000 | Freq: Every day | NASAL | 2 refills | Status: DC

## 2018-01-16 MED ORDER — LEVOCETIRIZINE DIHYDROCHLORIDE 5 MG PO TABS
5.0000 mg | ORAL_TABLET | Freq: Every day | ORAL | 2 refills | Status: DC
Start: 2018-01-16 — End: 2018-11-17

## 2018-01-16 MED ORDER — SPIRONOLACTONE 25 MG PO TABS: 25.0000 mg | ORAL_TABLET | Freq: Every day | ORAL | 2 refills | Status: DC

## 2018-01-16 MED ORDER — LISINOPRIL 10 MG PO TABS: 10.0000 mg | ORAL_TABLET | Freq: Every day | ORAL | 2 refills | Status: DC

## 2018-01-16 NOTE — Progress Notes (Signed)
[name] Carroll - 62 y.o. male MRN [id number]  Date of birth: 1956/07/23  Subjective Chief Complaint  Patient presents with  . Establish Care    Est care, had CPE last summer, Tdap and Pneumonia vaccination?    HPI [name] Carroll is a 62 y.o. male here today to establish care with new PCP.  He was previously being followed at [facility].  He has a history of arthritis (s/p R knee replacement), HTN, hyperlipidemia and allergic rhinitis.  He has been followed by an allergist as well and started on astelin nasal spray and Xhance nasal spray.  May have asthma component as well as he has wheezing during peak allergy season.  Allergist recommended pneumonia vaccine.   -HTN:  Current treatment with verapamil, spironolactone and lisinopril.  He states he was on another type of diuretic previously however his potassium kept dropping and he could not tolerate potassium pills.  He has done well on current medications.  He denies side effects at this time.  He has not had any anginal symptoms, shortness of breath, palpitations, headaches or vision changes.   -HLD:  Treated with vytorin which he is tolerating well.  Denies symptoms of myalgias or GI upset.    -Arthritis:  S/p R knee replacement and may be getting L knee done soon.  Manages with OTC NSAID or tylenol as needed.   ROS:  ROS completed and negative except as noted per HPI   Allergies  Allergen Reactions  . Sulfa Antibiotics Hives    Past Medical History:  Diagnosis Date  . Arthritis    "knees, wrists, knuckles" (06/12/2017)  . Concussion 12/2016   "hit head on shelf"  . High cholesterol   . History of kidney stones   . Hypertension     Past Surgical History:  Procedure Laterality Date  . APPENDECTOMY    . CARDIAC CATHETERIZATION    . KNEE ARTHROSCOPY W/ MENISCAL REPAIR Left   . TOTAL KNEE ARTHROPLASTY Right 06/12/2017  . TOTAL KNEE ARTHROPLASTY Right 06/12/2017   Procedure: RIGHT TOTAL KNEE ARTHROPLASTY;  Surgeon:  [name], MD;  Location: [facility] OR;  Service: Orthopedics;  Laterality: Right;  . WRIST ARTHROSCOPY Right    "separated wrist"    Social History   Socioeconomic History  . Marital status: Married    Spouse name: Not on file  . Number of children: Not on file  . Years of education: Not on file  . Highest education level: Not on file  Occupational History  . Not on file  Social Needs  . Financial resource strain: Not on file  . Food insecurity:    Worry: Not on file    Inability: Not on file  . Transportation needs:    Medical: Not on file    Non-medical: Not on file  Tobacco Use  . Smoking status: Never Smoker  . Smokeless tobacco: Never Used  Substance and Sexual Activity  . Alcohol use: Yes    Alcohol/week: 2.4 oz    Types: 4 Shots of liquor per week  . Drug use: No  . Sexual activity: Not on file  Lifestyle  . Physical activity:    Days per week: Not on file    Minutes per session: Not on file  . Stress: Not on file  Relationships  . Social connections:    Talks on phone: Not on file    Gets together: Not on file    Attends religious service: Not on file  Active member of club or organization: Not on file    Attends meetings of clubs or organizations: Not on file    Relationship status: Not on file  Other Topics Concern  . Not on file  Social History Narrative  . Not on file    Family History  Problem Relation Age of Onset  . Allergic rhinitis Mother   . Early death Mother   . Allergic rhinitis Brother   . Hyperlipidemia Brother   . Hearing loss Father   . Heart disease Father   . Hyperlipidemia Father   . Hypertension Father   . Stroke Father   . Hyperlipidemia Sister   . Stroke Maternal Grandfather   . Heart disease Maternal Grandfather   . Stroke Paternal Grandmother     Health Maintenance  Topic Date Due  . Hepatitis C Screening  05/17/58  . HIV Screening  02/03/1973  . TETANUS/TDAP  02/03/1977  . COLONOSCOPY  02/04/2008  .  INFLUENZA VACCINE  04/17/2018    ----------------------------------------------------------------------------------------------------------------------------------------------------------------------------------------------------------------- Physical Exam BP 110/64 (BP Location: Left Arm, Patient Position: Sitting, Cuff Size: Large)   Pulse 81   Temp 98.7 F (37.1 C) (Oral)   Ht 5' 8.25" (1.734 m)   Wt (!) 350 lb (158.8 kg)   SpO2 95%   BMI 52.83 kg/m   Physical Exam  Constitutional: He is oriented to person, place, and time. He appears well-nourished. No distress.  HENT:  Head: Normocephalic and atraumatic.  Mouth/Throat: Oropharynx is clear and moist.  Eyes: No scleral icterus.  Neck: Neck supple. No thyromegaly present.  Cardiovascular: Normal rate, regular rhythm and normal heart sounds.  Pulmonary/Chest: Effort normal and breath sounds normal. No respiratory distress. He has no wheezes.  Musculoskeletal: He exhibits edema (trace bilateral).  Lymphadenopathy:    He has no cervical adenopathy.  Neurological: He is alert and oriented to person, place, and time.  Skin: No rash noted.  Psychiatric: He has a normal mood and affect. His behavior is normal.    ------------------------------------------------------------------------------------------------------------------------------------------------------------------------------------------------------------------- Assessment and Plan  Essential hypertension BP is well controlled at this time Continue current medications Follow low salt diet Work on weight reduction.   Bilateral primary osteoarthritis of knee Has already had r knee replaced, planning on having L knee done soon.   Hyperlipidemia Tolerating vytorin well, will continue Will request records from his labs he had completed with CPE last fall.   Allergic rhinitis Allergic rhinitis due to multiple allergens He will continue to see allergist and continue on  current medications Possible asthma component as well, pneumovax given.

## 2018-01-16 NOTE — Assessment & Plan Note (Addendum)
Allergic rhinitis due to multiple allergens He will continue to see allergist and continue on current medications Possible asthma component as well, pneumovax given.

## 2018-01-16 NOTE — Patient Instructions (Signed)
It was very nice to meet you! Continue current medications I will plan to see you back in 6 months if you are still in town

## 2018-01-16 NOTE — Assessment & Plan Note (Signed)
Tolerating vytorin well, will continue Will request records from his labs he had completed with CPE last fall.

## 2018-01-16 NOTE — Assessment & Plan Note (Signed)
Has already had r knee replaced, planning on having L knee done soon.

## 2018-01-16 NOTE — Assessment & Plan Note (Signed)
BP is well controlled at this time Continue current medications Follow low salt diet Work on weight reduction.

## 2018-02-03 ENCOUNTER — Encounter: Payer: Self-pay | Admitting: Family Medicine

## 2018-02-26 ENCOUNTER — Observation Stay (HOSPITAL_COMMUNITY)
Admission: EM | Admit: 2018-02-26 | Discharge: 2018-02-28 | Disposition: A | Discharge status: Home / Self Care | Payer: BLUE CROSS/BLUE SHIELD | Attending: Family Medicine | Admitting: Family Medicine

## 2018-02-26 ENCOUNTER — Emergency Department (HOSPITAL_COMMUNITY): Payer: BLUE CROSS/BLUE SHIELD

## 2018-02-26 ENCOUNTER — Encounter (HOSPITAL_COMMUNITY): Payer: Self-pay | Admitting: Emergency Medicine

## 2018-02-26 ENCOUNTER — Other Ambulatory Visit: Payer: Self-pay

## 2018-02-26 DIAGNOSIS — I10 Essential (primary) hypertension: Secondary | ICD-10-CM | POA: Diagnosis not present

## 2018-02-26 DIAGNOSIS — Z7951 Long term (current) use of inhaled steroids: Secondary | ICD-10-CM | POA: Insufficient documentation

## 2018-02-26 DIAGNOSIS — R0789 Other chest pain: Secondary | ICD-10-CM | POA: Diagnosis not present

## 2018-02-26 DIAGNOSIS — R789 Finding of unspecified substance, not normally found in blood: Secondary | ICD-10-CM | POA: Insufficient documentation

## 2018-02-26 DIAGNOSIS — Z6841 Body Mass Index (BMI) 40.0 and over, adult: Secondary | ICD-10-CM | POA: Insufficient documentation

## 2018-02-26 DIAGNOSIS — Z7982 Long term (current) use of aspirin: Secondary | ICD-10-CM | POA: Insufficient documentation

## 2018-02-26 DIAGNOSIS — Z8249 Family history of ischemic heart disease and other diseases of the circulatory system: Secondary | ICD-10-CM | POA: Diagnosis not present

## 2018-02-26 DIAGNOSIS — N179 Acute kidney failure, unspecified: Secondary | ICD-10-CM | POA: Diagnosis not present

## 2018-02-26 DIAGNOSIS — J309 Allergic rhinitis, unspecified: Secondary | ICD-10-CM | POA: Diagnosis not present

## 2018-02-26 DIAGNOSIS — M1990 Unspecified osteoarthritis, unspecified site: Secondary | ICD-10-CM | POA: Insufficient documentation

## 2018-02-26 DIAGNOSIS — E785 Hyperlipidemia, unspecified: Secondary | ICD-10-CM | POA: Diagnosis not present

## 2018-02-26 DIAGNOSIS — Z79899 Other long term (current) drug therapy: Secondary | ICD-10-CM | POA: Insufficient documentation

## 2018-02-26 DIAGNOSIS — E7800 Pure hypercholesterolemia, unspecified: Secondary | ICD-10-CM | POA: Insufficient documentation

## 2018-02-26 DIAGNOSIS — R079 Chest pain, unspecified: Principal | ICD-10-CM | POA: Insufficient documentation

## 2018-02-26 DIAGNOSIS — Z96651 Presence of right artificial knee joint: Secondary | ICD-10-CM | POA: Diagnosis not present

## 2018-02-26 DIAGNOSIS — R600 Localized edema: Secondary | ICD-10-CM | POA: Insufficient documentation

## 2018-02-26 DIAGNOSIS — R0902 Hypoxemia: Secondary | ICD-10-CM | POA: Diagnosis not present

## 2018-02-26 LAB — COMPREHENSIVE METABOLIC PANEL
ALT: 24 U/L (ref 17–63)
AST: 23 U/L (ref 15–41)
Albumin: 3.6 g/dL (ref 3.5–5.0)
Alkaline Phosphatase: 58 U/L (ref 38–126)
Anion gap: 11 (ref 5–15)
BUN: 11 mg/dL (ref 6–20)
CO2: 23 mmol/L (ref 22–32)
Calcium: 8.9 mg/dL (ref 8.9–10.3)
Chloride: 104 mmol/L (ref 101–111)
Creatinine, Ser: 1.19 mg/dL (ref 0.61–1.24)
GFR calc Af Amer: >60 mL/min (ref >60)
GFR calc non Af Amer: >60 mL/min (ref >60)
Glucose, Bld: 110 mg/dL — ABNORMAL HIGH (ref 65–99)
Potassium: 4.1 mmol/L (ref 3.5–5.1)
Sodium: 138 mmol/L (ref 135–145)
Total Bilirubin: 0.5 mg/dL (ref 0.3–1.2)
Total Protein: 7 g/dL (ref 6.5–8.1)

## 2018-02-26 LAB — TROPONIN I
Troponin I: <0.03 ng/mL (ref <0.03)
Troponin I: <0.03 ng/mL (ref <0.03)

## 2018-02-26 LAB — CBC
HCT: 46 % (ref 39.0–52.0)
HCT: 45.9 % (ref 39.0–52.0)
HEMOGLOBIN: 14.5 g/dL (ref 13.0–17.0)
Hemoglobin: 14.6 g/dL (ref 13.0–17.0)
MCH: 27.1 pg (ref 26.0–34.0)
MCH: 26.9 pg (ref 26.0–34.0)
MCHC: 31.7 g/dL (ref 30.0–36.0)
MCHC: 31.6 g/dL (ref 30.0–36.0)
MCV: 85.3 fL (ref 78.0–100.0)
MCV: 85.2 fL (ref 78.0–100.0)
Platelets: 167 10*3/uL (ref 150–400)
Platelets: 166 10*3/uL (ref 150–400)
RBC: 5.39 MIL/uL (ref 4.22–5.81)
RBC: 5.39 MIL/uL (ref 4.22–5.81)
RDW: 14.7 % (ref 11.5–15.5)
RDW: 14.7 % (ref 11.5–15.5)
WBC: 6.1 10*3/uL (ref 4.0–10.5)
WBC: 6.3 10*3/uL (ref 4.0–10.5)

## 2018-02-26 LAB — CREATININE, SERUM
CREATININE: 1.93 mg/dL — AB (ref 0.61–1.24)
GFR calc Af Amer: 42 mL/min — ABNORMAL LOW (ref >60)
GFR calc non Af Amer: 36 mL/min — ABNORMAL LOW (ref >60)

## 2018-02-26 LAB — D-DIMER, QUANTITATIVE (NOT AT ARMC): D DIMER QUANT: 0.36 ug{FEU}/mL (ref 0.00–0.50)

## 2018-02-26 LAB — I-STAT TROPONIN, ED: Troponin i, poc: 0 ng/mL (ref 0.00–0.08)

## 2018-02-26 LAB — LIPASE, BLOOD: LIPASE: 37 U/L (ref 11–51)

## 2018-02-26 MED ORDER — SPIRONOLACTONE 25 MG PO TABS
25.0000 mg | ORAL_TABLET | Freq: Every day | ORAL | Status: DC
  Administered 2018-02-27 – 2018-02-28 (×2): 25 mg via ORAL
  Filled 2018-02-26 (×2): qty 1

## 2018-02-26 MED ORDER — ACETAMINOPHEN 325 MG PO TABS
650.0000 mg | ORAL_TABLET | ORAL | Status: DC | PRN
  Administered 2018-02-27: 650 mg via ORAL
  Filled 2018-02-26 (×2): qty 2

## 2018-02-26 MED ORDER — EZETIMIBE-SIMVASTATIN 10-40 MG PO TABS
1.0000 | ORAL_TABLET | Freq: Every day | ORAL | Status: DC
  Administered 2018-02-27 (×2): 1 via ORAL
  Filled 2018-02-26 (×2): qty 1

## 2018-02-26 MED ORDER — FAMOTIDINE 20 MG PO TABS
40.0000 mg | ORAL_TABLET | Freq: Once | ORAL | Status: AC
  Administered 2018-02-26: 40 mg via ORAL
  Filled 2018-02-26: qty 2

## 2018-02-26 MED ORDER — GI COCKTAIL ~~LOC~~
30.0000 mL | Freq: Once | ORAL | Status: AC
  Administered 2018-02-26: 30 mL via ORAL
  Filled 2018-02-26: qty 30

## 2018-02-26 MED ORDER — VERAPAMIL HCL 120 MG PO TABS
120.0000 mg | ORAL_TABLET | Freq: Every day | ORAL | Status: DC
  Administered 2018-02-27 (×2): 120 mg via ORAL
  Filled 2018-02-26 (×4): qty 1

## 2018-02-26 MED ORDER — NITROGLYCERIN IN D5W 200-5 MCG/ML-% IV SOLN: 0.0000 ug/min | Freq: Once | INTRAVENOUS | Status: DC

## 2018-02-26 MED ORDER — NITROGLYCERIN 2 % TD OINT
1.0000 [in_us] | TOPICAL_OINTMENT | Freq: Once | TRANSDERMAL | Status: AC
  Administered 2018-02-26: 1 [in_us] via TOPICAL
  Filled 2018-02-26: qty 1

## 2018-02-26 MED ORDER — ASPIRIN EC 325 MG PO TBEC
325.0000 mg | DELAYED_RELEASE_TABLET | Freq: Every day | ORAL | Status: DC
  Administered 2018-02-27: 325 mg via ORAL
  Filled 2018-02-26 (×2): qty 1

## 2018-02-26 MED ORDER — ONDANSETRON HCL 4 MG/2ML IJ SOLN: 4.0000 mg | Freq: Four times a day (QID) | INTRAMUSCULAR | Status: DC | PRN

## 2018-02-26 MED ORDER — MAGNESIUM OXIDE 400 (241.3 MG) MG PO TABS
400.0000 mg | ORAL_TABLET | Freq: Every day | ORAL | Status: DC
Start: 2018-02-27 — End: 2018-02-28
  Administered 2018-02-27 – 2018-02-28 (×2): 400 mg via ORAL
  Filled 2018-02-26 (×2): qty 1

## 2018-02-26 MED ORDER — NITROGLYCERIN 0.4 MG SL SUBL
0.4000 mg | SUBLINGUAL_TABLET | SUBLINGUAL | Status: DC | PRN
  Administered 2018-02-27: 0.4 mg via SUBLINGUAL
  Filled 2018-02-26: qty 1

## 2018-02-26 MED ORDER — LISINOPRIL 10 MG PO TABS
10.0000 mg | ORAL_TABLET | Freq: Every day | ORAL | Status: DC
  Administered 2018-02-27 (×2): 10 mg via ORAL
  Filled 2018-02-26 (×2): qty 1

## 2018-02-26 MED ORDER — FLUTICASONE PROPIONATE 50 MCG/ACT NA SUSP
1.0000 | Freq: Two times a day (BID) | NASAL | Status: DC
  Administered 2018-02-28: 1 via NASAL
  Filled 2018-02-26 (×2): qty 16

## 2018-02-26 MED ORDER — VITAMIN E 180 MG (400 UNIT) PO CAPS
400.0000 [IU] | ORAL_CAPSULE | Freq: Every day | ORAL | Status: DC
  Administered 2018-02-28: 400 [IU] via ORAL
  Filled 2018-02-26 (×2): qty 1

## 2018-02-26 MED ORDER — ADULT MULTIVITAMIN W/MINERALS CH
1.0000 | ORAL_TABLET | Freq: Every day | ORAL | Status: DC
  Administered 2018-02-27: 1 via ORAL
  Filled 2018-02-26 (×2): qty 1

## 2018-02-26 MED ORDER — ENOXAPARIN SODIUM 40 MG/0.4ML ~~LOC~~ SOLN
40.0000 mg | SUBCUTANEOUS | Status: DC
  Administered 2018-02-27 – 2018-02-28 (×2): 40 mg via SUBCUTANEOUS
  Filled 2018-02-26 (×5): qty 0.4

## 2018-02-26 NOTE — H&P (Signed)
History and Physical    [name] Carroll WUJ:[id number] DOB: June 27, 1956 DOA: 02/26/2018  PCP: Everrett Coombe, DO  Patient coming from: Home.  Chief Complaint: Chest pain.  HPI: [name] Carroll is a 62 y.o. male with history of hypertension, hyperlipidemia, allergic rhinitis started experiencing chest pain when he was at home working on the computer.  Chest pain started suddenly substernal radiating to left arm with some shortness of breath.  Denies any diaphoresis palpitation nausea vomiting abdominal pain.  Denies any fever chills or cough.  Since the pain persisted patient went to the urgent care and was referred to the ER.  EMS gave sublingual nitroglycerin which improved her chest pain but still persisted.  ED Course: In the ER EKG was showing normal sinus rhythm with possible old inferior infarct.  Chest x-ray was unremarkable troponin was negative.  Patient admitted for further management for chest pain.  Chest pain concerning for angina.  Review of Systems: As per HPI, rest all negative.   Past Medical History:  Diagnosis Date  . Arthritis    "knees, wrists, knuckles" (06/12/2017)  . Concussion 12/2016   "hit head on shelf"  . High cholesterol   . History of kidney stones   . Hypertension     Past Surgical History:  Procedure Laterality Date  . APPENDECTOMY    . CARDIAC CATHETERIZATION    . KNEE ARTHROSCOPY W/ MENISCAL REPAIR Left   . TOTAL KNEE ARTHROPLASTY Right 06/12/2017  . TOTAL KNEE ARTHROPLASTY Right 06/12/2017   Procedure: RIGHT TOTAL KNEE ARTHROPLASTY;  Surgeon: [name], MD;  Location: [facility] OR;  Service: Orthopedics;  Laterality: Right;  . WRIST ARTHROSCOPY Right    "separated wrist"     reports that he has never smoked. He has never used smokeless tobacco. He reports that he drinks about 2.4 oz of alcohol per week. He reports that he does not use drugs.  Allergies  Allergen Reactions  . Sulfa Antibiotics Hives    Family History  Problem Relation Age of  Onset  . Allergic rhinitis Mother   . Early death Mother   . Allergic rhinitis Brother   . Hyperlipidemia Brother   . Hearing loss Father   . Heart disease Father   . Hyperlipidemia Father   . Hypertension Father   . Stroke Father   . Hyperlipidemia Sister   . Stroke Maternal Grandfather   . Heart disease Maternal Grandfather   . Stroke Paternal Grandmother     Prior to Admission medications   Medication Sig Start Date End Date Taking? Authorizing Provider  azelastine (ASTELIN) 0.1 % nasal spray Place 2 sprays into both nostrils at bedtime. Use in each nostril as directed Patient taking differently: Place 1 spray into both nostrils 2 (two) times daily.  01/16/18  Yes Everrett Coombe, DO  ezetimibe-simvastatin (VYTORIN) 10-40 MG tablet Take 1 tablet by mouth at bedtime. 01/16/18  Yes Everrett Coombe, DO  Fluticasone Propionate (XHANCE) 93 MCG/ACT EXHU Place 1 spray into both nostrils 2 (two) times daily. 10/24/17  Yes Padgett, Pilar Grammes, MD  Glucosamine-Chondroitin (OSTEO BI-FLEX REGULAR STRENGTH PO) Take 1 tablet by mouth daily.   Yes [provider]  levocetirizine (XYZAL) 5 MG tablet Take 1 tablet (5 mg total) by mouth at bedtime. 01/16/18  Yes Everrett Coombe, DO  lisinopril (PRINIVIL,ZESTRIL) 10 MG tablet Take 1 tablet (10 mg total) by mouth at bedtime. 01/16/18  Yes Everrett Coombe, DO  Magnesium 500 MG CAPS Take 500 mg by mouth daily.  Yes [provider]  Multiple Vitamin (MULTIVITAMIN WITH MINERALS) TABS tablet Take 1 tablet by mouth daily.   Yes [provider]  spironolactone (ALDACTONE) 25 MG tablet Take 1 tablet (25 mg total) by mouth daily. 01/16/18  Yes [name], [name], DO  verapamil (CALAN) 120 MG tablet Take 1 tablet (120 mg total) by mouth 2 (two) times daily. Patient taking differently: Take 120 mg by mouth daily.  01/16/18  Yes [name], [name], DO  vitamin E 400 UNIT capsule Take 400 Units by mouth daily.   Yes [provider]  aspirin 325 MG  EC tablet TAKE 1 TABLET BY MOUTH EVERY DAY Patient not taking: Reported on 02/26/2018 07/08/17   Nadara Mustarduda, Marcus V, MD  famotidine (PEPCID) 20 MG tablet Take 1 tablet (20 mg total) by mouth at bedtime. Patient not taking: Reported on 02/26/2018 01/16/18   [name], [name], DO    Physical Exam: Vitals:   02/26/18 1930 02/26/18 2000 02/26/18 2030 02/26/18 2045  BP: 122/74 104/63 109/65 106/72  Pulse: 78 75 68 71  Resp: 20 17    SpO2: 94% 97% 97% 96%  Height:          Constitutional: Moderately built and nourished. Vitals:   02/26/18 1930 02/26/18 2000 02/26/18 2030 02/26/18 2045  BP: 122/74 104/63 109/65 106/72  Pulse: 78 75 68 71  Resp: 20 17    SpO2: 94% 97% 97% 96%  Height:       Eyes: Anicteric no pallor. ENMT: No discharge from the ears eyes nose or mouth. Neck: No mass felt.  No neck rigidity.  No JVD appreciated. Respiratory: No rhonchi or crepitations. Cardiovascular: S1-S2 heard no murmurs appreciated. Abdomen: Soft nontender bowel sounds present. Musculoskeletal: No edema.  No joint effusion. Skin: No rash. Neurologic: Alert awake oriented to time place and person.  Moves all extremities. Psychiatric: Appears normal per normal affect.   Labs on Admission: I have personally reviewed following labs and imaging studies  CBC: Recent Labs  Lab 02/26/18 1811  WBC 6.1  HGB 14.6  HCT 46.0  MCV 85.3  PLT 167   Basic Metabolic Panel: Recent Labs  Lab 02/26/18 1811  NA 138  K 4.1  CL 104  CO2 23  GLUCOSE 110*  BUN 11  CREATININE 1.19  CALCIUM 8.9   GFR: CrCl cannot be calculated (Unknown ideal weight.). Liver Function Tests: Recent Labs  Lab 02/26/18 1811  AST 23  ALT 24  ALKPHOS 58  BILITOT 0.5  PROT 7.0  ALBUMIN 3.6   Recent Labs  Lab 02/26/18 1811  LIPASE 37   No results for input(s): AMMONIA in the last 168 hours. Coagulation Profile: No results for input(s): INR, PROTIME in the last 168 hours. Cardiac Enzymes: Recent Labs  Lab  02/26/18 1811  TROPONINI <0.03   BNP (last 3 results) No results for input(s): PROBNP in the last 8760 hours. HbA1C: No results for input(s): HGBA1C in the last 72 hours. CBG: No results for input(s): GLUCAP in the last 168 hours. Lipid Profile: No results for input(s): CHOL, HDL, LDLCALC, TRIG, CHOLHDL, LDLDIRECT in the last 72 hours. Thyroid Function Tests: No results for input(s): TSH, T4TOTAL, FREET4, T3FREE, THYROIDAB in the last 72 hours. Anemia Panel: No results for input(s): VITAMINB12, FOLATE, FERRITIN, TIBC, IRON, RETICCTPCT in the last 72 hours. Urine analysis: No results found for: COLORURINE, APPEARANCEUR, LABSPEC, PHURINE, GLUCOSEU, HGBUR, BILIRUBINUR, KETONESUR, PROTEINUR, UROBILINOGEN, NITRITE, LEUKOCYTESUR Sepsis Labs: @LABRCNTIP (procalcitonin:4,lacticidven:4) )No results found for this or any previous visit (from  the past 240 hour(s)).   Radiological Exams on Admission: Dg Chest 2 View  Result Date: 02/26/2018 CLINICAL DATA:  Chest pressure. EXAM: CHEST - 2 VIEW COMPARISON:  None FINDINGS: The heart size and mediastinal contours are within normal limits. Both lungs are clear. The visualized skeletal structures are unremarkable. IMPRESSION: No active cardiopulmonary disease. Electronically Signed   By: Signa Kell M.D.   On: 02/26/2018 18:51    EKG: Independently reviewed.  Normal sinus rhythm with inferior Q waves.  Assessment/Plan Principal Problem:   Chest pain Active Problems:   Essential hypertension   Hyperlipidemia    1. Chest pain -concerning for angina.  Will keep patient on aspirin and n.p.o. past midnight.  PRN sublingual nitroglycerin.  Cycle cardiac markers check d-dimer 2D echo.  Cardiology notified. 2. Hypertension on verapamil lisinopril and spironolactone. 3. Hyperlipidemia on Vytorin. 4. History of allergic rhinitis.   DVT prophylaxis: Lovenox. Code Status: Full code. Family Communication: Family at the bedside. Disposition Plan:  Home. Consults called: Cardiology. Admission status: Observation.   Eduard Clos MD [facility] Pager [phone].  If 7PM-7AM, please contact night-coverage [email or web address] Password [facility]  02/26/2018, 9:04 PM

## 2018-02-26 NOTE — ED Notes (Signed)
Happy meal and water provided

## 2018-02-26 NOTE — ED Triage Notes (Signed)
Pt arrived EMS from his Dr. Isidore Moosffice Crushing CP x >2 hours, left to middle chest, worse with exertion. Given 324 ASA and 2 nitro prior to EMS arrival at office. Pain rated 3-10 currently. EMS vitals PTA  BP122/90 P78 RR18 RA96%. 20 LAC and given 1 nitro with EMS PTA.

## 2018-02-26 NOTE — ED Provider Notes (Signed)
[facility] EMERGENCY DEPARTMENT Provider Note   CSN: [id number] Arrival date & time: 02/26/18  1753     History   Chief Complaint No chief complaint on file.   HPI [name] Carroll is a 62 y.o. male.  62 year old male presents with sudden onset of substernal chest pain which radiated to his left arm which began at rest.  Characterized as a heaviness associated dyspnea but no diaphoresis.  Pain was worse with exertion better with rest.  [name] to urgent care center and they called EMS when he arrived there.  No recent cough or fever.  No vomiting noted.  EMS gave the patient 3 nitroglycerin which greatly improved his discomfort.  Patient denies any prior history of cardiac history.  He does have significant cardiac risk factors consisting of hypertension, high cholesterol, obesity.     Past Medical History:  Diagnosis Date  . Arthritis    "knees, wrists, knuckles" (06/12/2017)  . Concussion 12/2016   "hit head on shelf"  . High cholesterol   . History of kidney stones   . Hypertension     Patient Active Problem List   Diagnosis Date Noted  . Essential hypertension 01/16/2018  . Hyperlipidemia 01/16/2018  . Allergic rhinitis 01/16/2018  . Presence of right artificial knee joint 07/10/2017  . Total knee replacement status, right 06/12/2017  . Unilateral primary osteoarthritis, right knee   . Bilateral primary osteoarthritis of knee 04/11/2017    Past Surgical History:  Procedure Laterality Date  . APPENDECTOMY    . CARDIAC CATHETERIZATION    . KNEE ARTHROSCOPY W/ MENISCAL REPAIR Left   . TOTAL KNEE ARTHROPLASTY Right 06/12/2017  . TOTAL KNEE ARTHROPLASTY Right 06/12/2017   Procedure: RIGHT TOTAL KNEE ARTHROPLASTY;  Surgeon: [name], MD;  Location: [facility] OR;  Service: Orthopedics;  Laterality: Right;  . WRIST ARTHROSCOPY Right    "separated wrist"        Home Medications    Prior to Admission medications   Medication Sig Start Date End Date  Taking? Authorizing Provider  aspirin 325 MG EC tablet TAKE 1 TABLET BY MOUTH EVERY DAY 07/08/17   [name], MD  azelastine (ASTELIN) 0.1 % nasal spray Place 2 sprays into both nostrils at bedtime. Use in each nostril as directed 01/16/18   Everrett Coombe, DO  ezetimibe-simvastatin (VYTORIN) 10-40 MG tablet Take 1 tablet by mouth at bedtime. 01/16/18   Everrett Coombe, DO  famotidine (PEPCID) 20 MG tablet Take 1 tablet (20 mg total) by mouth at bedtime. 01/16/18   Everrett Coombe, DO  Fluticasone Propionate Timmothy Sours) 93 MCG/ACT EXHU Place 1 spray into both nostrils 2 (two) times daily. 10/24/17   Marcelyn Bruins, MD  levocetirizine (XYZAL) 5 MG tablet Take 1 tablet (5 mg total) by mouth at bedtime. 01/16/18   Everrett Coombe, DO  lisinopril (PRINIVIL,ZESTRIL) 10 MG tablet Take 1 tablet (10 mg total) by mouth at bedtime. 01/16/18   Everrett Coombe, DO  Magnesium 500 MG CAPS Take 500 mg by mouth 2 (two) times daily.    [provider]  Multiple Vitamin (MULTIVITAMIN WITH MINERALS) TABS tablet Take 1 tablet by mouth daily.    [provider]  spironolactone (ALDACTONE) 25 MG tablet Take 1 tablet (25 mg total) by mouth daily. 01/16/18   Everrett Coombe, DO  verapamil (CALAN) 120 MG tablet Take 1 tablet (120 mg total) by mouth 2 (two) times daily. 01/16/18   Everrett Coombe, DO  vitamin E 400 UNIT capsule Take  400 Units by mouth daily.    [provider]    Family History Family History  Problem Relation Age of Onset  . Allergic rhinitis Mother   . Early death Mother   . Allergic rhinitis Brother   . Hyperlipidemia Brother   . Hearing loss Father   . Heart disease Father   . Hyperlipidemia Father   . Hypertension Father   . Stroke Father   . Hyperlipidemia Sister   . Stroke Maternal Grandfather   . Heart disease Maternal Grandfather   . Stroke Paternal Grandmother     Social History Social History   Tobacco Use  . Smoking status: Never Smoker  . Smokeless tobacco:  Never Used  Substance Use Topics  . Alcohol use: Yes    Alcohol/week: 2.4 oz    Types: 4 Shots of liquor per week  . Drug use: No     Allergies   Sulfa antibiotics   Review of Systems Review of Systems  All other systems reviewed and are negative.    Physical Exam Updated Vital Signs Ht 1.727 m (5\' 8" )   BMI 53.22 kg/m   Physical Exam  Constitutional: He is oriented to person, place, and time. He appears well-developed and well-nourished.  Non-toxic appearance. No distress.  HENT:  Head: Normocephalic and atraumatic.  Eyes: Pupils are equal, round, and reactive to light. Conjunctivae, EOM and lids are normal.  Neck: Normal range of motion. Neck supple. No tracheal deviation present. No thyroid mass present.  Cardiovascular: Normal rate, regular rhythm and normal heart sounds. Exam reveals no gallop.  No murmur heard. Pulmonary/Chest: Effort normal and breath sounds normal. No stridor. No respiratory distress. He has no decreased breath sounds. He has no wheezes. He has no rhonchi. He has no rales.  Abdominal: Soft. Normal appearance and bowel sounds are normal. He exhibits no distension. There is no tenderness. There is no rebound and no CVA tenderness.  Musculoskeletal: Normal range of motion. He exhibits no edema or tenderness.  Neurological: He is alert and oriented to person, place, and time. He has normal strength. No cranial nerve deficit or sensory deficit. GCS eye subscore is 4. GCS verbal subscore is 5. GCS motor subscore is 6.  Skin: Skin is warm and dry. No abrasion and no rash noted.  Psychiatric: He has a normal mood and affect. His speech is normal and behavior is normal.  Nursing note and vitals reviewed.    ED Treatments / Results  Labs (all labs ordered are listed, but only abnormal results are displayed) Labs Reviewed  CBC  COMPREHENSIVE METABOLIC PANEL  LIPASE, BLOOD  TROPONIN I    EKG EKG Interpretation  Date/Time:  Wednesday February 26 2018  18:05:13 EDT Ventricular Rate:  89 PR Interval:    QRS Duration: 96 QT Interval:  355 QTC Calculation: 432 R Axis:   80 Text Interpretation:  Sinus rhythm Inferior infarct, old No significant change since last tracing Confirmed by Lorre Nick ([id number]) on 02/26/2018 6:13:19 PM   Radiology No results found.  Procedures Procedures (including critical care time)  Medications Ordered in ED Medications  nitroGLYCERIN 50 mg in dextrose 5 % 250 mL (0.2 mg/mL) infusion (has no administration in time range)     Initial Impression / Assessment and Plan / ED Course  I have reviewed the triage vital signs and the nursing notes.  Pertinent labs & imaging results that were available during my care of the patient were reviewed by me and considered in  my medical decision making (see chart for details).     Patient is EKG without acute ischemic changes here.  Troponin is negative patient's chest pain is rated as 2 out of 10 and he will be placed on nitro glycerin paste.  Was given aspirin by EMS.  Will admit to the hospitalist service  Final Clinical Impressions(s) / ED Diagnoses   Final diagnoses:  None    ED Discharge Orders    None       Lorre NickAllen, Lorita Forinash, MD 02/26/18 1926

## 2018-02-26 NOTE — ED Notes (Signed)
Patient transported to X-ray 

## 2018-02-27 ENCOUNTER — Observation Stay (HOSPITAL_BASED_OUTPATIENT_CLINIC_OR_DEPARTMENT_OTHER): Payer: BLUE CROSS/BLUE SHIELD

## 2018-02-27 ENCOUNTER — Encounter (HOSPITAL_COMMUNITY): Payer: Self-pay | Admitting: Physician Assistant

## 2018-02-27 DIAGNOSIS — R079 Chest pain, unspecified: Secondary | ICD-10-CM

## 2018-02-27 DIAGNOSIS — I10 Essential (primary) hypertension: Secondary | ICD-10-CM | POA: Diagnosis not present

## 2018-02-27 DIAGNOSIS — E782 Mixed hyperlipidemia: Secondary | ICD-10-CM | POA: Diagnosis not present

## 2018-02-27 DIAGNOSIS — R0789 Other chest pain: Secondary | ICD-10-CM | POA: Diagnosis not present

## 2018-02-27 LAB — BASIC METABOLIC PANEL
ANION GAP: 14 (ref 5–15)
BUN: 12 mg/dL (ref 6–20)
CALCIUM: 8.8 mg/dL — AB (ref 8.9–10.3)
CHLORIDE: 104 mmol/L (ref 101–111)
CO2: 19 mmol/L — AB (ref 22–32)
Creatinine, Ser: 1.18 mg/dL (ref 0.61–1.24)
GFR calc Af Amer: >60 mL/min (ref >60)
GFR calc non Af Amer: >60 mL/min (ref >60)
GLUCOSE: 95 mg/dL (ref 65–99)
POTASSIUM: 4.6 mmol/L (ref 3.5–5.1)
Sodium: 137 mmol/L (ref 135–145)

## 2018-02-27 LAB — RAPID URINE DRUG SCREEN, HOSP PERFORMED
Amphetamines: NOT DETECTED
Benzodiazepines: NOT DETECTED
COCAINE: NOT DETECTED
Opiates: NOT DETECTED
TETRAHYDROCANNABINOL: NOT DETECTED

## 2018-02-27 LAB — LIPID PANEL
CHOLESTEROL: 187 mg/dL (ref 0–200)
HDL: 35 mg/dL — AB (ref >40)
LDL CALC: 122 mg/dL — AB (ref 0–99)
TRIGLYCERIDES: 148 mg/dL (ref <150)
Total CHOL/HDL Ratio: 5.3 RATIO
VLDL: 30 mg/dL (ref 0–40)

## 2018-02-27 LAB — TROPONIN I: Troponin I: <0.03 ng/mL (ref <0.03)

## 2018-02-27 LAB — ECHOCARDIOGRAM COMPLETE: Height: 68 in

## 2018-02-27 LAB — TSH: TSH: 0.378 u[IU]/mL (ref 0.350–4.500)

## 2018-02-27 LAB — HIV ANTIBODY (ROUTINE TESTING W REFLEX): HIV Screen 4th Generation wRfx: NONREACTIVE

## 2018-02-27 MED ORDER — PERFLUTREN LIPID MICROSPHERE
1.0000 mL | INTRAVENOUS | Status: AC | PRN
  Administered 2018-02-27: 2 mL via INTRAVENOUS
  Filled 2018-02-27: qty 10

## 2018-02-27 MED ORDER — MORPHINE SULFATE (PF) 4 MG/ML IV SOLN
1.0000 mg | Freq: Once | INTRAVENOUS | Status: AC
  Administered 2018-02-27: 1 mg via INTRAVENOUS
  Filled 2018-02-27: qty 1

## 2018-02-27 MED ORDER — MORPHINE SULFATE (PF) 4 MG/ML IV SOLN: 1.0000 mg | INTRAVENOUS | Status: DC | PRN

## 2018-02-27 MED ORDER — FUROSEMIDE 10 MG/ML IJ SOLN
60.0000 mg | Freq: Once | INTRAMUSCULAR | Status: AC
  Administered 2018-02-27: 60 mg via INTRAVENOUS
  Filled 2018-02-27: qty 6

## 2018-02-27 MED ORDER — KETOROLAC TROMETHAMINE 30 MG/ML IJ SOLN
30.0000 mg | Freq: Once | INTRAMUSCULAR | Status: AC
Start: 2018-02-27 — End: 2018-02-27
  Administered 2018-02-27: 30 mg via INTRAVENOUS
  Filled 2018-02-27: qty 1

## 2018-02-27 NOTE — Progress Notes (Addendum)
[facility] on call paged for patient NPO diet. Paged to order diet for patient. Has been NPO all day. No procedures noted to be had. Awaiting call back.  Orders acknowledged, patient and wife updated.

## 2018-02-27 NOTE — ED Notes (Signed)
Dr. Sharl MaLama contacted to inform of chest pain after one nitro and bp of 97. New orders resulting.

## 2018-02-27 NOTE — Progress Notes (Signed)
[facility]  PROGRESS NOTE  [name] ManesRoger Carroll OZH:[id number] DOB: 1955/10/16 DOA: 02/26/2018 PCP: [name] CoombeMatthews, [name], DO   Brief HPI:    62 y.o. male with history of hypertension, hyperlipidemia, allergic rhinitis started experiencing chest pain when he was at home working on the computer.  Chest pain started suddenly substernal radiating to left arm with some shortness of breath.  Denies any diaphoresis palpitation nausea vomiting abdominal pain.  Denies any fever chills or cough.  Since the pain persisted patient went to the urgent care and was referred to the ER.  EMS gave sublingual nitroglycerin which improved her chest pain but still persisted.  ED Course: In the ER EKG was showing normal sinus rhythm with possible old inferior infarct.  Chest x-ray was unremarkable troponin was negative.  Patient admitted for further management for chest pain.  Chest pain concerning for angina.      Subjective   Patient seen and examined, continues to have intermittent chest pain.   Assessment/Plan:     1. Chest pain-patient has recurrent chest pain, has some musculoskeletal component.  Chest wall tenderness to palpation.  Cardiology has been consulted.  Start morphine 1 mg IV every 4 hours as needed for pain.  Continue to cycle cardiac markers. 2. Hypertension-blood pressure stable, continue verapamil, lisinopril, Aldactone. 3. Hyperlipidemia-continue Vytorin    DVT prophylaxis: Lovenox  Code Status: Full code  Family Communication: No family at bedside  Disposition Plan: likely home when medically ready for discharge   Consultants:  Cardiology  Procedures:  None   Antibiotics:   Anti-infectives (From admission, onward)   None       Objective   Vitals:   02/27/18 0915 02/27/18 0930 02/27/18 0945 02/27/18 1000  BP: 109/72 116/72 108/72   Pulse: 64 66 65   Resp: 12 15 11    Temp:    98.6 F (37 C)  TempSrc:    Oral  SpO2: 94% 93% 94%   Height:       No intake or  output data in the 24 hours ending 02/27/18 1431 There were no vitals filed for this visit.   Physical Examination:    General: Appears in no acute distress  Cardiovascular: S1-S2, regular  Respiratory: Clear to auscultation bilaterally  Abdomen: Soft, nontender, no organomegaly  Extremities: No cyanosis/clubbing/edema of the lower extremities  Neurologic: Alert oriented x3, no focal deficit     Data Reviewed: I have personally reviewed following labs and imaging studies  CBG: No results for input(s): GLUCAP in the last 168 hours.  CBC: Recent Labs  Lab 02/26/18 1811 02/26/18 2131  WBC 6.1 6.3  HGB 14.6 14.5  HCT 46.0 45.9  MCV 85.3 85.2  PLT 167 166    Basic Metabolic Panel: Recent Labs  Lab 02/26/18 1811 02/26/18 2131 02/27/18 0950  NA 138  --  137  K 4.1  --  4.6  CL 104  --  104  CO2 23  --  19*  GLUCOSE 110*  --  95  BUN 11  --  12  CREATININE 1.19 1.93* 1.18  CALCIUM 8.9  --  8.8*    No results found for this or any previous visit (from the past 240 hour(s)).   Liver Function Tests: Recent Labs  Lab 02/26/18 1811  AST 23  ALT 24  ALKPHOS 58  BILITOT 0.5  PROT 7.0  ALBUMIN 3.6   Recent Labs  Lab 02/26/18 1811  LIPASE 37   No results for input(s): AMMONIA in the  last 168 hours.  Cardiac Enzymes: Recent Labs  Lab 02/26/18 1811 02/26/18 2131 02/27/18 0300 02/27/18 0950  TROPONINI <0.03 <0.03 <0.03 <0.03   BNP (last 3 results) No results for input(s): BNP in the last 8760 hours.  ProBNP (last 3 results) No results for input(s): PROBNP in the last 8760 hours.    Studies: Dg Chest 2 View  Result Date: 02/26/2018 CLINICAL DATA:  Chest pressure. EXAM: CHEST - 2 VIEW COMPARISON:  None FINDINGS: The heart size and mediastinal contours are within normal limits. Both lungs are clear. The visualized skeletal structures are unremarkable. IMPRESSION: No active cardiopulmonary disease. Electronically Signed   By: [name] Carroll M.D.    On: 02/26/2018 18:51    Scheduled Meds: . aspirin EC  325 mg Oral Daily  . enoxaparin (LOVENOX) injection  40 mg Subcutaneous Q24H  . ezetimibe-simvastatin  1 tablet Oral QHS  . fluticasone  1 spray Each Nare BID  . lisinopril  10 mg Oral QHS  . magnesium oxide  400 mg Oral Daily  . multivitamin with minerals  1 tablet Oral Daily  . spironolactone  25 mg Oral Daily  . verapamil  120 mg Oral Daily  . vitamin E  400 Units Oral Daily      Time spent: 25 min  [name] Carroll   [facility] Pager [phone]. If 7PM-7AM, please contact night-coverage at [email or web address], Office  [phone]  password [email or web address]  02/27/2018, 2:31 PM  LOS: 0 days

## 2018-02-27 NOTE — ED Notes (Signed)
Pt states he has return of chest pain that he describes as 3/10 and pressure. Dr. Sharl MaLama contacted but in time for return call pt now states he is pain free. Dr. Sharl MaLama aware of varied pain levles and new orders resulting.

## 2018-02-27 NOTE — Consult Note (Addendum)
Cardiology Consultation:   [name] Carroll ID: Aaren Atallah; [id number]; 08-25-1956   Admit date: 02/26/2018 Date of Consult: 02/27/2018  Primary Care Provider: Everrett Coombe, DO Primary Cardiologist: New Primary Electrophysiologist:  n/a   [name] Carroll Profile:   [name] Carroll is a 62 y.o. male with a hx of HTN, HLD, FH CAD, obesity who is being seen today for the evaluation of chest pain at the request of Dr Sharl Ma.  History of Present Illness:   [name] Carroll was evaluated for CP in 2013 in [place], cath was clean.   [name] Carroll started having occasional episodes of chest pain, has had 2 in the last 2 months. "sitting on his chest". They would resolve in a couple of hours without intervention. Not like sx when he was in [place].   Sx began about 12:30 pm. Severe sharp pain in the center, then pressure across his upper chest. 10/10 at first. Worse w/ deep inspiration. Decreased to 5/10 in 5 minutes, but stayed there for a while. Went to 8/10 at about 2:00 pm. He called his wife and went UC.   At [facility], he got ASA 81 mg x 4 and SL NTG 2. Pain did not change much, but BP improved.   After getting ASA, he got heartburn, that resolved with a GI cocktail.   Got NTG paste, no help w/ the pain.   Before 2 months ago, he had never had this pain before.   Does not exercise, his knees are bad (R one replaced, L one needs it). Is busy around the house and yard. Is not limited by CP or SOB with this.   BP is normally well-controlled.    Past Medical History:  Diagnosis Date  . Arthritis    "knees, wrists, knuckles" (06/12/2017)  . Concussion 12/2016   "hit head on shelf"  . High cholesterol   . History of kidney stones   . Hypertension     Past Surgical History:  Procedure Laterality Date  . APPENDECTOMY    . CARDIAC CATHETERIZATION  2013   in [place], it was clean  . KNEE ARTHROSCOPY W/ MENISCAL REPAIR Left   . TOTAL KNEE ARTHROPLASTY Right 06/12/2017   Procedure: RIGHT TOTAL KNEE ARTHROPLASTY;   Surgeon: [name], MD;  Location: [facility] OR;  Service: Orthopedics;  Laterality: Right;  . WRIST ARTHROSCOPY Right    "separated wrist"     Prior to Admission medications   Medication Sig Start Date End Date Taking? Authorizing Provider  azelastine (ASTELIN) 0.1 % nasal spray Place 2 sprays into both nostrils at bedtime. Use in each nostril as directed [name] Carroll taking differently: Place 1 spray into both nostrils 2 (two) times daily.  01/16/18  Yes Everrett Coombe, DO  ezetimibe-simvastatin (VYTORIN) 10-40 MG tablet Take 1 tablet by mouth at bedtime. 01/16/18  Yes Everrett Coombe, DO  Fluticasone Propionate (XHANCE) 93 MCG/ACT EXHU Place 1 spray into both nostrils 2 (two) times daily. 10/24/17  Yes Padgett, Pilar Grammes, MD  Glucosamine-Chondroitin (OSTEO BI-FLEX REGULAR STRENGTH PO) Take 1 tablet by mouth daily.   Yes [provider]  levocetirizine (XYZAL) 5 MG tablet Take 1 tablet (5 mg total) by mouth at bedtime. 01/16/18  Yes Everrett Coombe, DO  lisinopril (PRINIVIL,ZESTRIL) 10 MG tablet Take 1 tablet (10 mg total) by mouth at bedtime. 01/16/18  Yes Everrett Coombe, DO  Magnesium 500 MG CAPS Take 500 mg by mouth daily.    Yes [provider]  Multiple Vitamin (MULTIVITAMIN WITH MINERALS) TABS tablet Take 1  tablet by mouth daily.   Yes [provider]  spironolactone (ALDACTONE) 25 MG tablet Take 1 tablet (25 mg total) by mouth daily. 01/16/18  Yes Everrett Coombe, DO  verapamil (CALAN) 120 MG tablet Take 1 tablet (120 mg total) by mouth 2 (two) times daily. [name] Carroll taking differently: Take 120 mg by mouth daily.  01/16/18  Yes Everrett Coombe, DO  vitamin E 400 UNIT capsule Take 400 Units by mouth daily.   Yes [provider]  aspirin 325 MG EC tablet TAKE 1 TABLET BY MOUTH EVERY DAY [name] Carroll not taking: Reported on 02/26/2018 07/08/17   [name], MD  famotidine (PEPCID) 20 MG tablet Take 1 tablet (20 mg total) by mouth at bedtime. [name] Carroll not taking: Reported on  02/26/2018 01/16/18   Everrett Coombe, DO    Inpatient Medications: Scheduled Meds: . aspirin EC  325 mg Oral Daily  . enoxaparin (LOVENOX) injection  40 mg Subcutaneous Q24H  . ezetimibe-simvastatin  1 tablet Oral QHS  . fluticasone  1 spray Each Nare BID  . lisinopril  10 mg Oral QHS  . magnesium oxide  400 mg Oral Daily  . multivitamin with minerals  1 tablet Oral Daily  . spironolactone  25 mg Oral Daily  . verapamil  120 mg Oral Daily  . vitamin E  400 Units Oral Daily   Continuous Infusions:  PRN Meds: acetaminophen, morphine injection, nitroGLYCERIN, ondansetron (ZOFRAN) IV  Allergies:    Allergies  Allergen Reactions  . Sulfa Antibiotics Hives    Social History:   Social History   Socioeconomic History  . Marital status: Married    Spouse name: Not on file  . Number of children: Not on file  . Years of education: Not on file  . Highest education level: Not on file  Occupational History  . Occupation: Chief of Staff: [employer]  Social Needs  . Financial resource strain: Not on file  . Food insecurity:    Worry: Not on file    Inability: Not on file  . Transportation needs:    Medical: Not on file    Non-medical: Not on file  Tobacco Use  . Smoking status: Never Smoker  . Smokeless tobacco: Never Used  Substance and Sexual Activity  . Alcohol use: Yes    Alcohol/week: 2.4 oz    Types: 4 Shots of liquor per week  . Drug use: No  . Sexual activity: Not on file  Lifestyle  . Physical activity:    Days per week: Not on file    Minutes per session: Not on file  . Stress: Not on file  Relationships  . Social connections:    Talks on phone: Not on file    Gets together: Not on file    Attends religious service: Not on file    Active member of club or organization: Not on file    Attends meetings of clubs or organizations: Not on file    Relationship status: Not on file  . Intimate partner violence:    Fear of current or ex partner: Not on file      Emotionally abused: Not on file    Physically abused: Not on file    Forced sexual activity: Not on file  Other Topics Concern  . Not on file  Social History Narrative   Lives w/ wife in [place].    Family History:   Family History  Problem Relation Age of Onset  . Allergic rhinitis  Mother   . Early death Mother   . Allergic rhinitis Brother   . Hyperlipidemia Brother   . Hearing loss Father   . Heart disease Father   . Hyperlipidemia Father   . Hypertension Father   . Stroke Father   . Hyperlipidemia Sister   . Stroke Maternal Grandfather   . Heart disease Maternal Grandfather   . Stroke Paternal Grandmother    Family Status:  Family Status  Relation Name Status  . Mother  Deceased  . Brother  Alive  . Father  Alive  . Sister  Alive  . MGF  (Not Specified)  . PGM  (Not Specified)    ROS:  Please see the history of present illness.  All other ROS reviewed and negative.     Physical Exam/Data:   Vitals:   02/27/18 0830 02/27/18 0845 02/27/18 0900 02/27/18 0915  BP: 110/72 112/71 110/73 109/72  Pulse: 65 65 64 64  Resp: 13 11 10 12   SpO2: 95% 94% 94% 94%  Height:       No intake or output data in the 24 hours ending 02/27/18 0929 There were no vitals filed for this visit. Body mass index is 53.22 kg/m.  General:  Well nourished, well developed, in no acute distress HEENT: normal Lymph: no adenopathy Neck: no JVD seen, difficult to assess secondary to body habitus Endocrine:  No thryomegaly Vascular: No carotid bruits; 4/4 extremity pulses 2+, without bruits  Cardiac:  normal S1, S2; RRR; no murmur  Lungs:  clear to auscultation bilaterally, no wheezing, rhonchi or rales; + chest wall tenderness lower mid sternal area Abd: soft, nontender, no hepatomegaly  Ext: no edema Musculoskeletal:  No deformities, BUE and BLE strength normal and equal Skin: warm and dry  Neuro:  CNs 2-12 intact, no focal abnormalities noted Psych:  Normal affect   EKG:  The EKG  was personally reviewed and demonstrates: 02/26/2018, sinus rhythm, heart rate 89, inferior Q waves noted but are no different from 05/2017 ECG Telemetry:  Telemetry was personally reviewed and demonstrates: Sinus rhythm  Relevant CV Studies:  ECHO: Ordered  Laboratory Data:  Chemistry Recent Labs  Lab 02/26/18 1811 02/26/18 2131  NA 138  --   K 4.1  --   CL 104  --   CO2 23  --   GLUCOSE 110*  --   BUN 11  --   CREATININE 1.19 1.93*  CALCIUM 8.9  --   GFRNONAA >60 36*  GFRAA >60 42*  ANIONGAP 11  --     Lab Results  Component Value Date   ALT 24 02/26/2018   AST 23 02/26/2018   ALKPHOS 58 02/26/2018   BILITOT 0.5 02/26/2018   Hematology Recent Labs  Lab 02/26/18 1811 02/26/18 2131  WBC 6.1 6.3  RBC 5.39 5.39  HGB 14.6 14.5  HCT 46.0 45.9  MCV 85.3 85.2  MCH 27.1 26.9  MCHC 31.7 31.6  RDW 14.7 14.7  PLT 167 166   Cardiac Enzymes Recent Labs  Lab 02/26/18 1811 02/26/18 2131 02/27/18 0300  TROPONINI <0.03 <0.03 <0.03    Recent Labs  Lab 02/26/18 1817  TROPIPOC 0.00    DDimer  Recent Labs  Lab 02/26/18 2147  DDIMER 0.36   TSH:  Lab Results  Component Value Date   TSH 0.36 (A) 09/25/2016   Lipids: Lab Results  Component Value Date   CHOL 237 (A) 03/14/2017   HDL 39 03/14/2017   LDLCALC 162 03/14/2017  TRIG 178 (A) 03/14/2017    Radiology/Studies:  Dg Chest 2 View  Result Date: 02/26/2018 CLINICAL DATA:  Chest pressure. EXAM: CHEST - 2 VIEW COMPARISON:  None FINDINGS: The heart size and mediastinal contours are within normal limits. Both lungs are clear. The visualized skeletal structures are unremarkable. IMPRESSION: No active cardiopulmonary disease. Electronically Signed   By: Signa Kellaylor  Stroud M.D.   On: 02/26/2018 18:51    Assessment and Plan:   Principal Problem: 1.  Chest pain -Typical and atypical features are noted. -He has not had exertional symptoms, and has some chest wall tenderness.  - Cardiac enzymes are negative and  nitroglycerin was not much help. -However, he has an abnormal ECG and describes the pain is a severe pressure. -Discuss appropriate evaluation with MD. -Recheck BMET now.  2.  Abnormal TSH -Recheck  Active Problems: 3.  Essential hypertension -Blood pressure well controlled  4.  Hyperlipidemia  - recheck profile, he is on Vytorin, LFTs are normal   For questions or updates, please contact [facility] Please consult [email or web address] for contact info under Cardiology/STEMI.   Signed, Theodore DemarkRhonda Barrett, PA-C  02/27/2018 9:29 AM  [name] Carroll seen and examined  I agree with findings as noted by R Barrett above .  Pt had normal coronary arteries by cath in 2013   Pt says symptoms were different   May have been related to potassium he says  Pt does regular activity, limited some by DJD   But, mowed the lawn 1 wk ago without problem Developed  episode of chest pressure over the past couple months  Resolve in a couple hours  Says he has allergies. Yesterday developed severe sharp pain and then chest pressure   Symptoms worse with inspiration    Went to urgent care then transferred to ED  SInce admit he says the pressure in his chest has not gone away   With activity or with deep breath it gets worse    It is better than on admit but still there   On  Exam, pt is a morbidly obese 62 yo    NEck :  JVP is nromal   LUngs are clear with no wheezes or rales    Cardiac exam:  RRR   No rubs Chest  Very tender in lower sternal and xiphoid area   This brings on some of pain/makes worse Ext with tr edema  ([name] Carroll notices swelling)  EKG without acute changes   Labs:  R/O for MI Echo with normal LV function   Impression:   Atypical CP   I do not think CP represents ischemia   Long lasting and pleuritic    Pressure may represent some increased volume (exam difficult )  ? Allergies   (no wheezes however) Sharp component and xiphoid discomfort is musculoskeletal)   Recomm:  Would give lasix x 1     Follow response Would also try antiinflammatory   I would not sched cardiac testing now.    Dietrich PatesPaula Ritesh Opara

## 2018-02-27 NOTE — Progress Notes (Signed)
Patient arrived to the unit from the ED. He is alert and oriented and vital signs are stable. Patient has been placed on telemetry and CCMD has been notified. Patient is NPO at this time. He denies any pain at this time. Patient is resting comfortably in the bed. Will continue to monitor.

## 2018-02-27 NOTE — ED Notes (Addendum)
Pt awake on entry to room and states he has chest pain at right chest that he noted on waking a few minutes before. Pt states 3/10 describes as pressure and states he has no shob, nausea or sweatting and pain is non radiating.

## 2018-02-27 NOTE — Progress Notes (Signed)
Paged Dr. Tenny Crawoss. Patient and family wanting to speak with provider

## 2018-02-27 NOTE — Progress Notes (Signed)
Patient and family wanting to speak with provider. Called Theodore Demarkhonda Barrett, PA and notified that family would like to speak with physician.

## 2018-02-27 NOTE — Progress Notes (Signed)
  Echocardiogram 2D Echocardiogram has been performed.  [name] Carroll 02/27/2018, 2:44 PM

## 2018-02-28 DIAGNOSIS — I10 Essential (primary) hypertension: Secondary | ICD-10-CM | POA: Diagnosis not present

## 2018-02-28 DIAGNOSIS — R079 Chest pain, unspecified: Secondary | ICD-10-CM | POA: Diagnosis not present

## 2018-02-28 DIAGNOSIS — R0789 Other chest pain: Secondary | ICD-10-CM | POA: Diagnosis not present

## 2018-02-28 DIAGNOSIS — E782 Mixed hyperlipidemia: Secondary | ICD-10-CM | POA: Diagnosis not present

## 2018-02-28 LAB — BASIC METABOLIC PANEL
Anion gap: 7 (ref 5–15)
BUN: 21 mg/dL — AB (ref 6–20)
CHLORIDE: 101 mmol/L (ref 101–111)
CO2: 26 mmol/L (ref 22–32)
CREATININE: 1.44 mg/dL — AB (ref 0.61–1.24)
Calcium: 8.7 mg/dL — ABNORMAL LOW (ref 8.9–10.3)
GFR calc Af Amer: 60 mL/min — ABNORMAL LOW (ref >60)
GFR calc non Af Amer: 51 mL/min — ABNORMAL LOW (ref >60)
GLUCOSE: 144 mg/dL — AB (ref 65–99)
Potassium: 4.5 mmol/L (ref 3.5–5.1)
SODIUM: 134 mmol/L — AB (ref 135–145)

## 2018-02-28 MED ORDER — FUROSEMIDE 20 MG PO TABS: 20.0000 mg | ORAL_TABLET | ORAL | 11 refills | Status: DC | PRN

## 2018-02-28 NOTE — Progress Notes (Signed)
Progress Note  Patient Name: [name] Carroll Date of Encounter: 02/28/2018  Primary Cardiologist: Dietrich PatesPaula Valissa Lyvers, MD   Subjective   Feels good   No chest pressure   Breatihg is OK  Inpatient Medications    Scheduled Meds: . aspirin EC  325 mg Oral Daily  . enoxaparin (LOVENOX) injection  40 mg Subcutaneous Q24H  . ezetimibe-simvastatin  1 tablet Oral QHS  . fluticasone  1 spray Each Nare BID  . lisinopril  10 mg Oral QHS  . magnesium oxide  400 mg Oral Daily  . multivitamin with minerals  1 tablet Oral Daily  . spironolactone  25 mg Oral Daily  . verapamil  120 mg Oral Daily  . vitamin E  400 Units Oral Daily   Continuous Infusions:  PRN Meds: acetaminophen, morphine injection, nitroGLYCERIN, ondansetron (ZOFRAN) IV   Vital Signs    Vitals:   02/27/18 1000 02/27/18 1943 02/27/18 2356 02/28/18 0503  BP:  119/72 (!) 82/46 105/65  Pulse:  83  69  Resp:  14 11 17   Temp: 98.6 F (37 C) 98.7 F (37.1 C) 98.1 F (36.7 C) 98.3 F (36.8 C)  TempSrc: Oral Oral Oral Oral  SpO2:  97% 97% 94%  Height:        Intake/Output Summary (Last 24 hours) at 02/28/2018 0635 Last data filed at 02/28/2018 0503 Gross per 24 hour  Intake -  Output 1425 ml  Net -1425 ml   There were no vitals filed for this visit.  Telemetry    SR   Personally Reviewed  ECG      Physical Exam   GEN:   Morbidly obese 62 yo in No acute distress.   Neck: No JVD Cardiac: RRR, no murmurs, rubs, or gallops.  Respiratory: Clear to auscultation bilaterally. GI: Soft, nontender, non-distended  MS: No edema; No deformity. Neuro:  Nonfocal  Psych: Normal affect   Labs    Chemistry Recent Labs  Lab 02/26/18 1811 02/26/18 2131 02/27/18 0950 02/28/18 0404  NA 138  --  137 134*  K 4.1  --  4.6 4.5  CL 104  --  104 101  CO2 23  --  19* 26  GLUCOSE 110*  --  95 144*  BUN 11  --  12 21*  CREATININE 1.19 1.93* 1.18 1.44*  CALCIUM 8.9  --  8.8* 8.7*  PROT 7.0  --   --   --   ALBUMIN 3.6  --    --   --   AST 23  --   --   --   ALT 24  --   --   --   ALKPHOS 58  --   --   --   BILITOT 0.5  --   --   --   GFRNONAA >60 36* >60 51*  GFRAA >60 42* >60 60*  ANIONGAP 11  --  14 7     Hematology Recent Labs  Lab 02/26/18 1811 02/26/18 2131  WBC 6.1 6.3  RBC 5.39 5.39  HGB 14.6 14.5  HCT 46.0 45.9  MCV 85.3 85.2  MCH 27.1 26.9  MCHC 31.7 31.6  RDW 14.7 14.7  PLT 167 166    Cardiac Enzymes Recent Labs  Lab 02/26/18 1811 02/26/18 2131 02/27/18 0300 02/27/18 0950  TROPONINI <0.03 <0.03 <0.03 <0.03    Recent Labs  Lab 02/26/18 1817  TROPIPOC 0.00     BNPNo results for input(s): BNP, PROBNP in the last 168 hours.  DDimer  Recent Labs  Lab 02/26/18 2147  DDIMER 0.36     Radiology    Dg Chest 2 View  Result Date: 02/26/2018 CLINICAL DATA:  Chest pressure. EXAM: CHEST - 2 VIEW COMPARISON:  None FINDINGS: The heart size and mediastinal contours are within normal limits. Both lungs are clear. The visualized skeletal structures are unremarkable. IMPRESSION: No active cardiopulmonary disease. Electronically Signed   By: Signa Kell M.D.   On: 02/26/2018 18:51    Cardiac Studies     Patient Profile     62 y.o. male hx normal cath in 2013, HTN, HL, morbid obesity who presents for evaluatoin of chest pressure   Assessment & Plan    1  Chest discomfort / pressure  Pt doing much better than yesteday   He was given lasix x 1 with 1400 cc output  Echo showed normal systolic and diastolic dysfunction but I think he has tendency to retain I encouraged him to watch salt and fluid intate   3 G NA Weigh every am     If wts up consequtively or LE edema could try lasix 1-2x per wk most   If needs more will need to be evaluated further     Chest pain was costochondral   IMproved with tramadol  OK to d/c from cardiac standpoint F/U with PCP  Dietrich Pates    2  HTN  BP good , little low at times but pt asymptomatic    3  HL   ON Vytorin   For questions or  updates, please contact [facility] Please consult [email or web address] for contact info under Cardiology/STEMI.      Signed, Dietrich Pates, MD  02/28/2018, 6:35 AM

## 2018-02-28 NOTE — Discharge Summary (Signed)
Physician Discharge Summary  [name] Carroll ZOX:[id number] DOB: 06/18/1956 DOA: 02/26/2018  PCP: Everrett Coombe, DO  Admit date: 02/26/2018 Discharge date: 02/28/2018  Time spent: 40* minutes  Recommendations for Outpatient Follow-up:  1. Follow up PCP in 2 weeks 2. Follow up cardiology in  2 weeks 3. Check BMP in 2 weeks as outpatient   Discharge Diagnoses:  Principal Problem:   Chest pain Active Problems:   Essential hypertension   Hyperlipidemia   Discharge Condition: Stable  Diet recommendation: Heart healthy diet  There were no vitals filed for this visit.  History of present illness:  62 y.o.malewithhistory of hypertension, hyperlipidemia, allergic rhinitis started experiencing chest pain when he was at home working on the computer. Chest pain started suddenly substernal radiating to left arm with some shortness of breath. Denies any diaphoresis palpitation nausea vomiting abdominal pain. Denies any fever chills or cough. Since the pain persisted patient went to the urgent care and was referred to the ER. EMS gave sublingual nitroglycerin which improved her chest pain but still persisted.  ED Course:In the ER EKG was showing normal sinus rhythm with possible old inferior infarct. Chest x-ray was unremarkable troponin was negative. Patient admitted for further management for chest pain. Chest pain concerning for angina.    Hospital Course:   Chest pain- resolved, likely musculoskeletal, cardiology jhas signed off.  Lower ext edema- improved with IV Lasix 60 mg x1 given yesterday. Echo shows no abnormality, cardiology recommends Lasix 20 mg po q week prn for fluid.  Hypertension - BP is stable, continue verapamil, lisinopril, Aldactone.  Hyperlipidemia- continue Vytorin.  Mild AKI- Cr is 1.44, likely from Lasix. Lasix has been discontinued, check BMP in 2 weeks  Procedures:  Echocardiogram  Consultations:  Cardiology  Discharge Exam: Vitals:   02/28/18 0503 02/28/18 0815  BP: 105/65 123/80  Pulse: 69 74  Resp: 17 18  Temp: 98.3 F (36.8 C) 98 F (36.7 C)  SpO2: 94% 96%    General: Appears in no acute distress Cardiovascular: S1S2 RRR Respiratory: Clear bilaterally  Discharge Instructions    Allergies as of 02/28/2018      Reactions   Sulfa Antibiotics Hives      Medication List    STOP taking these medications   aspirin 325 MG EC tablet     TAKE these medications   azelastine 0.1 % nasal spray Commonly known as:  ASTELIN Place 2 sprays into both nostrils at bedtime. Use in each nostril as directed What changed:    how much to take  when to take this  additional instructions   ezetimibe-simvastatin 10-40 MG tablet Commonly known as:  VYTORIN Take 1 tablet by mouth at bedtime.   famotidine 20 MG tablet Commonly known as:  PEPCID Take 1 tablet (20 mg total) by mouth at bedtime.   Fluticasone Propionate 93 MCG/ACT Exhu Commonly known as:  XHANCE Place 1 spray into both nostrils 2 (two) times daily.   furosemide 20 MG tablet Commonly known as:  LASIX Take 1 tablet (20 mg total) by mouth as needed for fluid (Every 7 days).   levocetirizine 5 MG tablet Commonly known as:  XYZAL Take 1 tablet (5 mg total) by mouth at bedtime.   lisinopril 10 MG tablet Commonly known as:  PRINIVIL,ZESTRIL Take 1 tablet (10 mg total) by mouth at bedtime.   Magnesium 500 MG Caps Take 500 mg by mouth daily.   multivitamin with minerals Tabs tablet Take 1 tablet by mouth daily.   OSTEO BI-FLEX  REGULAR STRENGTH PO Take 1 tablet by mouth daily.   spironolactone 25 MG tablet Commonly known as:  ALDACTONE Take 1 tablet (25 mg total) by mouth daily.   verapamil 120 MG tablet Commonly known as:  CALAN Take 1 tablet (120 mg total) by mouth 2 (two) times daily. What changed:  when to take this   vitamin E 400 UNIT capsule Take 400 Units by mouth daily.      Allergies  Allergen Reactions  . Sulfa  Antibiotics Hives      The results of significant diagnostics from this hospitalization (including imaging, microbiology, ancillary and laboratory) are listed below for reference.    Significant Diagnostic Studies: Dg Chest 2 View  Result Date: 02/26/2018 CLINICAL DATA:  Chest pressure. EXAM: CHEST - 2 VIEW COMPARISON:  None FINDINGS: The heart size and mediastinal contours are within normal limits. Both lungs are clear. The visualized skeletal structures are unremarkable. IMPRESSION: No active cardiopulmonary disease. Electronically Signed   By: Signa Kellaylor  Stroud M.D.   On: 02/26/2018 18:51    Microbiology: No results found for this or any previous visit (from the past 240 hour(s)).   Labs: Basic Metabolic Panel: Recent Labs  Lab 02/26/18 1811 02/26/18 2131 02/27/18 0950 02/28/18 0404  NA 138  --  137 134*  K 4.1  --  4.6 4.5  CL 104  --  104 101  CO2 23  --  19* 26  GLUCOSE 110*  --  95 144*  BUN 11  --  12 21*  CREATININE 1.19 1.93* 1.18 1.44*  CALCIUM 8.9  --  8.8* 8.7*   Liver Function Tests: Recent Labs  Lab 02/26/18 1811  AST 23  ALT 24  ALKPHOS 58  BILITOT 0.5  PROT 7.0  ALBUMIN 3.6   Recent Labs  Lab 02/26/18 1811  LIPASE 37   No results for input(s): AMMONIA in the last 168 hours. CBC: Recent Labs  Lab 02/26/18 1811 02/26/18 2131  WBC 6.1 6.3  HGB 14.6 14.5  HCT 46.0 45.9  MCV 85.3 85.2  PLT 167 166   Cardiac Enzymes: Recent Labs  Lab 02/26/18 1811 02/26/18 2131 02/27/18 0300 02/27/18 0950  TROPONINI <0.03 <0.03 <0.03 <0.03       Signed:  Meredeth IdeGagan S Syre Knerr MD.  [facility] 02/28/2018, 10:36 AM

## 2018-02-28 NOTE — Progress Notes (Signed)
Patient is ready for discharge. Patient has all of his belongings. Patient has had all of his questions regarding discharge answered. Patient is alert and oriented and vital signs are stable. IV has been removed and catheter intact and no complications.He has been taken off of telemetry and CCMD has been notified. Patient will be transported home by his wife who is here with him now. He will leave the unit by wheelchair and meet wife at the Emergency entrance.

## 2018-03-06 ENCOUNTER — Ambulatory Visit (INDEPENDENT_AMBULATORY_CARE_PROVIDER_SITE_OTHER): Payer: BLUE CROSS/BLUE SHIELD | Admitting: Family Medicine

## 2018-03-06 ENCOUNTER — Encounter: Payer: Self-pay | Admitting: Family Medicine

## 2018-03-06 VITALS — BP 94/64 | HR 101 | Temp 97.6°F | Ht 68.0 in | Wt 351.0 lb

## 2018-03-06 DIAGNOSIS — R079 Chest pain, unspecified: Secondary | ICD-10-CM | POA: Diagnosis not present

## 2018-03-06 DIAGNOSIS — M940 Chondrocostal junction syndrome [Tietze]: Secondary | ICD-10-CM

## 2018-03-06 DIAGNOSIS — N179 Acute kidney failure, unspecified: Secondary | ICD-10-CM | POA: Diagnosis not present

## 2018-03-06 LAB — BASIC METABOLIC PANEL
BUN: 22 mg/dL (ref 6–23)
CALCIUM: 9.7 mg/dL (ref 8.4–10.5)
CO2: 26 mEq/L (ref 19–32)
CREATININE: 1.38 mg/dL (ref 0.40–1.50)
Chloride: 99 mEq/L (ref 96–112)
GFR: 67.58 mL/min (ref >60.00)
GLUCOSE: 134 mg/dL — AB (ref 70–99)
POTASSIUM: 4.4 meq/L (ref 3.5–5.1)
Sodium: 135 mEq/L (ref 135–145)

## 2018-03-06 LAB — VITAMIN D 25 HYDROXY (VIT D DEFICIENCY, FRACTURES): VITD: 25.56 ng/mL — ABNORMAL LOW (ref 30.00–100.00)

## 2018-03-06 MED ORDER — SPIRONOLACTONE 25 MG PO TABS: 25.0000 mg | ORAL_TABLET | Freq: Every day | ORAL | 2 refills | Status: DC

## 2018-03-06 MED ORDER — FUROSEMIDE 20 MG PO TABS: 20.0000 mg | ORAL_TABLET | ORAL | 6 refills | Status: AC | PRN

## 2018-03-06 NOTE — Assessment & Plan Note (Addendum)
R/o for ACS, negative D-Dimer and CXR. Chest pain consistent with MSK cause, likely costochondritis Discussed applying heating pad to area prn for comfort.  If renal function has returned to normal can try meloxicam as well He is working with landlord to have mold removed from home.

## 2018-03-06 NOTE — Progress Notes (Signed)
[name] Carroll - 62 y.o. male MRN [id number]  Date of birth: 11/04/1955  Subjective Chief Complaint  Patient presents with  . Hospitalization Follow-up    hospital follow up/still has chest discomfort on right side,difficult breathing. allergic to Mold inside house--inside house is worse.     HPI [name] Carroll is a 62 y.o. male here today for hospital follow up.  He was recently hospitalized due to chest pain concerning for angina.  He was ruled out for ACS and had negative D-Dimer and CXR.  Pain has improved some but still present, located mainly on R side with some radiation into L upper ribs.  He is concerned that mold in his home may be contributing.  He does endorse cough but denies shortness of breath.  Pain is worse if up and moving around.  Denies fever, chills, rash associated with pain.  Of note he did have elevated serum creatinine in the hospital.   ROS:  ROS completed and negative except as noted per HPI Allergies  Allergen Reactions  . Penicillins Hives    Swelling up/anything mold base  . Sulfa Antibiotics Hives    Past Medical History:  Diagnosis Date  . Arthritis    "knees, wrists, knuckles" (06/12/2017)  . Concussion 12/2016   "hit head on shelf"  . High cholesterol   . History of kidney stones   . Hypertension     Past Surgical History:  Procedure Laterality Date  . APPENDECTOMY    . CARDIAC CATHETERIZATION  2013   in [place], it was clean  . KNEE ARTHROSCOPY W/ MENISCAL REPAIR Left   . TOTAL KNEE ARTHROPLASTY Right 06/12/2017   Procedure: RIGHT TOTAL KNEE ARTHROPLASTY;  Surgeon: [name], MD;  Location: [facility] OR;  Service: Orthopedics;  Laterality: Right;  . WRIST ARTHROSCOPY Right    "separated wrist"    Social History   Socioeconomic History  . Marital status: Married    Spouse name: Not on file  . Number of children: Not on file  . Years of education: Not on file  . Highest education level: Not on file  Occupational History  . Occupation:  Chief of Staff: [employer]  Social Needs  . Financial resource strain: Not on file  . Food insecurity:    Worry: Not on file    Inability: Not on file  . Transportation needs:    Medical: Not on file    Non-medical: Not on file  Tobacco Use  . Smoking status: Never Smoker  . Smokeless tobacco: Never Used  Substance and Sexual Activity  . Alcohol use: Yes    Alcohol/week: 2.4 oz    Types: 4 Shots of liquor per week  . Drug use: No  . Sexual activity: Not on file  Lifestyle  . Physical activity:    Days per week: Not on file    Minutes per session: Not on file  . Stress: Not on file  Relationships  . Social connections:    Talks on phone: Not on file    Gets together: Not on file    Attends religious service: Not on file    Active member of club or organization: Not on file    Attends meetings of clubs or organizations: Not on file    Relationship status: Not on file  Other Topics Concern  . Not on file  Social History Narrative   Lives w/ wife in [place].    Family History  Problem Relation Age of Onset  .  Allergic rhinitis Mother   . Early death Mother   . Allergic rhinitis Brother   . Hyperlipidemia Brother   . Hearing loss Father   . Heart disease Father   . Hyperlipidemia Father   . Hypertension Father   . Stroke Father   . Hyperlipidemia Sister   . Stroke Maternal Grandfather   . Heart disease Maternal Grandfather   . Stroke Paternal Grandmother     Health Maintenance  Topic Date Due  . Hepatitis C Screening  1955/12/25  . COLONOSCOPY  02/04/2008  . INFLUENZA VACCINE  04/17/2018  . TETANUS/TDAP  01/17/2028  . HIV Screening  Completed    ----------------------------------------------------------------------------------------------------------------------------------------------------------------------------------------------------------------- Physical Exam BP 94/64   Pulse (!) 101   Temp 97.6 F (36.4 C) (Oral)   Ht 5\' 8"  (1.727 m)   Wt  (!) 351 lb (159.2 kg)   SpO2 96%   BMI 53.37 kg/m   Physical Exam  Constitutional: He is oriented to person, place, and time. He appears well-nourished.  HENT:  Head: Normocephalic and atraumatic.  Mouth/Throat: Oropharynx is clear and moist.  Eyes: No scleral icterus.  Neck: Neck supple. No thyromegaly present.  Cardiovascular: Normal rate, regular rhythm and normal heart sounds.  Pulmonary/Chest: Effort normal and breath sounds normal.  Abdominal: Soft. Bowel sounds are normal. He exhibits no distension. There is no tenderness.  Musculoskeletal:  TTP along costochondral junction of R lower ribs.   Lymphadenopathy:    He has no cervical adenopathy.  Neurological: He is alert and oriented to person, place, and time.  Skin: Skin is warm and dry. No rash noted.  Psychiatric: He has a normal mood and affect. His behavior is normal.    ------------------------------------------------------------------------------------------------------------------------------------------------------------------------------------------------------------------- Assessment and Plan  Chest pain R/o for ACS, negative D-Dimer and CXR. Chest pain consistent with MSK cause, likely costochondritis Discussed applying heating pad to area prn for comfort.  If renal function has returned to normal can try meloxicam as well He is working with landlord to have mold removed from home.   AKI (acute kidney injury) (HCC) Recheck creatinine, reduce furosemide to every other day.

## 2018-03-06 NOTE — Assessment & Plan Note (Signed)
Recheck creatinine, reduce furosemide to every other day.

## 2018-03-06 NOTE — Patient Instructions (Signed)
Costochondritis Costochondritis is swelling and irritation (inflammation) of the tissue (cartilage) that connects your ribs to your breastbone (sternum). This causes pain in the front of your chest. The pain usually starts gradually and involves more than one rib. What are the causes? The exact cause of this condition is not always known. It results from stress on the cartilage where your ribs attach to your sternum. The cause of this stress could be:  Chest injury (trauma).  Exercise or activity, such as lifting.  Severe coughing.  What increases the risk? You may be at higher risk for this condition if you:  Are male.  Are 30?60 years old.  Recently started a new exercise or work activity.  Have low levels of vitamin D.  Have a condition that makes you cough frequently.  What are the signs or symptoms? The main symptom of this condition is chest pain. The pain:  Usually starts gradually and can be sharp or dull.  Gets worse with deep breathing, coughing, or exercise.  Gets better with rest.  May be worse when you press on the sternum-rib connection (tenderness).  How is this diagnosed? This condition is diagnosed based on your symptoms, medical history, and a physical exam. Your health care provider will check for tenderness when pressing on your sternum. This is the most important finding. You may also have tests to rule out other causes of chest pain. These may include:  A chest X-ray to check for lung problems.  An electrocardiogram (ECG) to see if you have a heart problem that could be causing the pain.  An imaging scan to rule out a chest or rib fracture.  How is this treated? This condition usually goes away on its own over time. Your health care provider may prescribe an NSAID to reduce pain and inflammation. Your health care provider may also suggest that you:  Rest and avoid activities that make pain worse.  Apply heat or cold to the area to reduce pain  and inflammation.  Do exercises to stretch your chest muscles.  If these treatments do not help, your health care provider may inject a numbing medicine at the sternum-rib connection to help relieve the pain. Follow these instructions at home:  Avoid activities that make pain worse. This includes any activities that use chest, abdominal, and side muscles.  If directed, put ice on the painful area: ? Put ice in a plastic bag. ? Place a towel between your skin and the bag. ? Leave the ice on for 20 minutes, 2-3 times a day.  If directed, apply heat to the affected area as often as told by your health care provider. Use the heat source that your health care provider recommends, such as a moist heat pack or a heating pad. ? Place a towel between your skin and the heat source. ? Leave the heat on for 20-30 minutes. ? Remove the heat if your skin turns bright red. This is especially important if you are unable to feel pain, heat, or cold. You may have a greater risk of getting burned.  Take over-the-counter and prescription medicines only as told by your health care provider.  Return to your normal activities as told by your health care provider. Ask your health care provider what activities are safe for you.  Keep all follow-up visits as told by your health care provider. This is important. Contact a health care provider if:  You have chills or a fever.  Your pain does not go   away or it gets worse.  You have a cough that does not go away (is persistent). Get help right away if:  You have shortness of breath. This information is not intended to replace advice given to you by your health care provider. Make sure you discuss any questions you have with your health care provider. Document Released: 06/13/2005 Document Revised: 03/23/2016 Document Reviewed: 12/28/2015 Elsevier Interactive Patient Education  2018 Elsevier Inc.   

## 2018-03-07 ENCOUNTER — Other Ambulatory Visit: Payer: Self-pay | Admitting: Family Medicine

## 2018-03-07 MED ORDER — MELOXICAM 15 MG PO TABS: 15.0000 mg | ORAL_TABLET | Freq: Every day | ORAL | 0 refills | Status: DC

## 2018-03-13 ENCOUNTER — Ambulatory Visit (INDEPENDENT_AMBULATORY_CARE_PROVIDER_SITE_OTHER): Payer: BLUE CROSS/BLUE SHIELD | Admitting: Family Medicine

## 2018-03-13 ENCOUNTER — Encounter: Payer: Self-pay | Admitting: Family Medicine

## 2018-03-13 VITALS — BP 128/70 | HR 88 | Ht 68.0 in | Wt 354.1 lb

## 2018-03-13 DIAGNOSIS — M940 Chondrocostal junction syndrome [Tietze]: Secondary | ICD-10-CM

## 2018-03-13 MED ORDER — CYCLOBENZAPRINE HCL 10 MG PO TABS: 10.0000 mg | ORAL_TABLET | Freq: Every day | ORAL | 0 refills | Status: DC

## 2018-03-13 MED ORDER — PREDNISONE 10 MG (48) PO TBPK: ORAL_TABLET | ORAL | 0 refills | Status: DC

## 2018-03-13 NOTE — Progress Notes (Signed)
Subjective:  Patient ID: [name] Carroll, male    DOB: 1956-02-02  Age: 62 y.o. MRN: [id number]  CC: Back Pain   HPI [name] Carroll presents for patient presents with a 4-week history of chest pain that he describes along his sternums approximately where his ribs articulate.  There is no injury history.  He does no heavy lifting on his job.  There is pain with movement through his chest wall and with breathing.  He has no fever or cough.  There is no exertional component nausea or diaphoresis or dyspnea on exertion.  Status post ER visit in the recent past where he was ruled out for MI.  Recent lipid profile does show mildly elevated LDL.  He has been taking meloxicam without much relief.  He does not smoke.  Outpatient Medications Prior to Visit  Medication Sig Dispense Refill  . aspirin 81 MG tablet     . azelastine (ASTELIN) 0.1 % nasal spray Place 2 sprays into both nostrils at bedtime. Use in each nostril as directed (Patient taking differently: Place 1 spray into both nostrils 2 (two) times daily. ) 90 mL 2  . ezetimibe-simvastatin (VYTORIN) 10-40 MG tablet Take 1 tablet by mouth at bedtime. 90 tablet 2  . famotidine (PEPCID) 20 MG tablet Take 1 tablet (20 mg total) by mouth at bedtime. 90 tablet 2  . furosemide (LASIX) 20 MG tablet Take 1 tablet (20 mg total) by mouth as needed for fluid (every other day). 30 tablet 6  . Glucosamine-Chondroitin (OSTEO BI-FLEX REGULAR STRENGTH PO) Take 1 tablet by mouth daily.    Marland Kitchen levocetirizine (XYZAL) 5 MG tablet Take 1 tablet (5 mg total) by mouth at bedtime. 90 tablet 2  . lisinopril (PRINIVIL,ZESTRIL) 10 MG tablet Take 1 tablet (10 mg total) by mouth at bedtime. 90 tablet 2  . Magnesium 500 MG CAPS Take 500 mg by mouth daily.     . Multiple Vitamin (MULTIVITAMIN WITH MINERALS) TABS tablet Take 1 tablet by mouth daily.    Marland Kitchen spironolactone (ALDACTONE) 25 MG tablet Take 1 tablet (25 mg total) by mouth daily. 90 tablet 2  . verapamil (CALAN) 120 MG tablet  Take 1 tablet (120 mg total) by mouth 2 (two) times daily. (Patient taking differently: Take 120 mg by mouth daily. ) 180 tablet 2  . vitamin E 400 UNIT capsule Take 400 Units by mouth daily.    . meloxicam (MOBIC) 15 MG tablet Take 1 tablet (15 mg total) by mouth daily. 30 tablet 0  . Fluticasone Propionate (XHANCE) 93 MCG/ACT EXHU Place 1 spray into both nostrils 2 (two) times daily. (Patient not taking: Reported on 03/06/2018) 16 mL 11   No facility-administered medications prior to visit.     ROS Review of Systems  Constitutional: Negative for chills, diaphoresis, fatigue, fever and unexpected weight change.  HENT: Negative.   Eyes: Negative.   Respiratory: Negative for cough, chest tightness and wheezing.   Cardiovascular: Positive for chest pain. Negative for palpitations and leg swelling.  Gastrointestinal: Negative.   Neurological: Negative.   Hematological: Negative.   Psychiatric/Behavioral: Negative.     Objective:  BP 128/70   Pulse 88   Ht 5\' 8"  (1.727 m)   Wt (!) 354 lb 2 oz (160.6 kg)   SpO2 94%   BMI 53.84 kg/m   BP Readings from Last 3 Encounters:  03/13/18 128/70  03/06/18 94/64  02/28/18 123/80    Wt Readings from Last 3 Encounters:  03/13/18 (!) 354  lb 2 oz (160.6 kg)  03/06/18 (!) 351 lb (159.2 kg)  01/16/18 (!) 350 lb (158.8 kg)    Physical Exam  Constitutional: He appears well-developed and well-nourished. No distress.  HENT:  Head: Normocephalic.  Right Ear: External ear normal.  Left Ear: External ear normal.  Eyes: Right eye exhibits no discharge. Left eye exhibits no discharge. No scleral icterus.  Skin: He is not diaphoretic.    Lab Results  Component Value Date   WBC 6.3 02/26/2018   HGB 14.5 02/26/2018   HCT 45.9 02/26/2018   PLT 166 02/26/2018   GLUCOSE 134 (H) 03/06/2018   CHOL 187 02/27/2018   TRIG 148 02/27/2018   HDL 35 (L) 02/27/2018   LDLCALC 122 (H) 02/27/2018   ALT 24 02/26/2018   AST 23 02/26/2018   NA 135  03/06/2018   K 4.4 03/06/2018   CL 99 03/06/2018   CREATININE 1.38 03/06/2018   BUN 22 03/06/2018   CO2 26 03/06/2018   TSH 0.378 02/27/2018   PSA 0.775 03/14/2017    Dg Chest 2 View  Result Date: 02/26/2018 CLINICAL DATA:  Chest pressure. EXAM: CHEST - 2 VIEW COMPARISON:  None FINDINGS: The heart size and mediastinal contours are within normal limits. Both lungs are clear. The visualized skeletal structures are unremarkable. IMPRESSION: No active cardiopulmonary disease. Electronically Signed   By: Signa Kellaylor  Stroud M.D.   On: 02/26/2018 18:51    Assessment & Plan:   [name] Carroll was seen today for back pain.  Diagnoses and all orders for this visit:  Costochondritis -     predniSONE (STERAPRED UNI-PAK 48 TAB) 10 MG (48) TBPK tablet; Pharm to instruct a 12 day dose pack. -     cyclobenzaprine (FLEXERIL) 10 MG tablet; Take 1 tablet (10 mg total) by mouth at bedtime.   I have discontinued Sharyne PeachRoger Gaida's Fluticasone Propionate and meloxicam. I am also having him start on predniSONE and cyclobenzaprine. Additionally, I am having him maintain his multivitamin with minerals, vitamin E, Magnesium, ezetimibe-simvastatin, famotidine, levocetirizine, lisinopril, verapamil, azelastine, Glucosamine-Chondroitin (OSTEO BI-FLEX REGULAR STRENGTH PO), aspirin, furosemide, and spironolactone.  Meds ordered this encounter  Medications  . predniSONE (STERAPRED UNI-PAK 48 TAB) 10 MG (48) TBPK tablet    Sig: Pharm to instruct a 12 day dose pack.    Dispense:  48 tablet    Refill:  0  . cyclobenzaprine (FLEXERIL) 10 MG tablet    Sig: Take 1 tablet (10 mg total) by mouth at bedtime.    Dispense:  30 tablet    Refill:  0   We will go ahead and move on to a 12-day Dosepak.  Extended discussion about prednisone side effects.  He will use Flexeril at night as needed.  He will obtain of rib binder for added support.  Follow-up with Dr. Ashley RoyaltyMatthews if not improving.  Follow-up: Return if symptoms worsen or fail to  improve.  Mliss SaxWilliam Alfred Jeter Tomey, MD

## 2018-04-15 ENCOUNTER — Encounter (INDEPENDENT_AMBULATORY_CARE_PROVIDER_SITE_OTHER): Payer: Self-pay | Admitting: Orthopedic Surgery

## 2018-04-15 ENCOUNTER — Ambulatory Visit (INDEPENDENT_AMBULATORY_CARE_PROVIDER_SITE_OTHER): Payer: Self-pay

## 2018-04-15 ENCOUNTER — Ambulatory Visit (INDEPENDENT_AMBULATORY_CARE_PROVIDER_SITE_OTHER): Payer: BLUE CROSS/BLUE SHIELD | Admitting: Orthopedic Surgery

## 2018-04-15 VITALS — Ht 68.0 in | Wt 354.0 lb

## 2018-04-15 DIAGNOSIS — M1712 Unilateral primary osteoarthritis, left knee: Secondary | ICD-10-CM | POA: Diagnosis not present

## 2018-04-15 DIAGNOSIS — M25562 Pain in left knee: Secondary | ICD-10-CM

## 2018-04-15 DIAGNOSIS — M25561 Pain in right knee: Secondary | ICD-10-CM

## 2018-04-15 DIAGNOSIS — Z96651 Presence of right artificial knee joint: Secondary | ICD-10-CM

## 2018-04-15 MED ORDER — LIDOCAINE HCL 1 % IJ SOLN
5.0000 mL | INTRAMUSCULAR | Status: AC | PRN
  Administered 2018-04-15: 5 mL

## 2018-04-15 MED ORDER — METHYLPREDNISOLONE ACETATE 40 MG/ML IJ SUSP
40.0000 mg | INTRAMUSCULAR | Status: AC | PRN
  Administered 2018-04-15: 40 mg via INTRA_ARTICULAR

## 2018-04-15 NOTE — Progress Notes (Signed)
Office Visit Note   Patient: [name] ManesRoger Carroll           Date of Birth: [birth year]           MRN: [id number] Visit Date: 04/15/2018              Requested by: [name], [name], DO [address] [place], [place] [address] PCP: [name], [name], DO  Chief Complaint  Patient presents with  . Right Knee - Pain, Edema  . Left Knee - Pain      HPI: Patient is a 62 year old gentleman who is 10 months status post right total knee arthroplasty.  Patient states recently has been having some pain in the patellofemoral joint of the right knee and has been having mechanical catching locking and giving way of the left knee.  Assessment & Plan: Visit Diagnoses:  1. Acute pain of both knees     Plan: The left knee was injected from the anterior medial portal he tolerated this well.  Patient states that [place] is [place] due to his allergies.  Patient states that he is going to hold on total knee replacement in the left due to his move.  Patient was given instructions for quad and VMO strengthening close chain kinetic exercises and these were demonstrated.  Follow-Up Instructions: Return if symptoms worsen or fail to improve.   Ortho Exam  Patient is alert, oriented, no adenopathy, well-dressed, normal affect, normal respiratory effort. Examination patient has difficulty getting from a sitting to a standing position.  Patient uses a cane for ambulation he has an antalgic gait.  Examination he has venous stasis changes in both legs but no open ulcers there is brawny skin color changes.  The importance of wearing compression was discussed patient states he usually wears compression stockings.  Examination of the right knee the patella tracks midline collateral ligaments are stable there is no instability of the knee no signs or symptoms of infection or DVT.  Examination the left knee he has crepitation with range of motion tenderness to palpation the patellofemoral joint as well as medial  lateral joint lines.  Collaterals and cruciates are stable.  Imaging: Xr Knee 1-2 Views Left  Result Date: 04/15/2018 2 view radiographs of the left knee shows tricompartmental arthritic changes with periarticular bony spurs bone-on-bone contact the medial joint line with varus alignment and osteophytic spurs in the patellofemoral joint.  Xr Knee 1-2 Views Right  Result Date: 04/15/2018 2 view radiographs of the right total knee arthroplasty shows a congruent joint no hardware loosening.  No images are attached to the encounter.  Labs: No results found for: HGBA1C, ESRSEDRATE, CRP, LABURIC, REPTSTATUS, GRAMSTAIN, CULT, LABORGA   Lab Results  Component Value Date   ALBUMIN 3.6 02/26/2018   ALBUMIN 4.5 10/24/2017   ALBUMIN 3.3 (L) 06/23/2017    Body mass index is 53.83 kg/m.  Orders:  Orders Placed This Encounter  Procedures  . XR Knee 1-2 Views Right  . XR Knee 1-2 Views Left   No orders of the defined types were placed in this encounter.    Procedures: Large Joint Inj: R knee on 04/15/2018 9:55 AM Indications: pain and diagnostic evaluation Details: 22 G 1.5 in needle, anteromedial approach  Arthrogram: No  Medications: 5 mL lidocaine 1 %; 40 mg methylPREDNISolone acetate 40 MG/ML Outcome: tolerated well, no immediate complications Procedure, treatment alternatives, risks and benefits explained, specific risks discussed. Consent was given by the patient. Immediately prior to procedure a time  out was called to verify the correct patient, procedure, equipment, support staff and site/side marked as required. Patient was prepped and draped in the usual sterile fashion.      Clinical Data: No additional findings.  ROS:  All other systems negative, except as noted in the HPI. Review of Systems  Objective: Vital Signs: Ht 5\' 8"  (1.727 m)   Wt (!) 354 lb (160.6 kg)   BMI 53.83 kg/m   Specialty Comments:  No specialty comments available.  PMFS  History: Patient Active Problem List   Diagnosis Date Noted  . Costochondritis 03/13/2018  . AKI (acute kidney injury) (HCC) 03/06/2018  . Chest pain 02/26/2018  . Essential hypertension 01/16/2018  . Hyperlipidemia 01/16/2018  . Allergic rhinitis 01/16/2018  . Presence of right artificial knee joint 07/10/2017  . Total knee replacement status, right 06/12/2017  . Unilateral primary osteoarthritis, right knee   . Bilateral primary osteoarthritis of knee 04/11/2017   Past Medical History:  Diagnosis Date  . Arthritis    "knees, wrists, knuckles" (06/12/2017)  . Concussion 12/2016   "hit head on shelf"  . High cholesterol   . History of kidney stones   . Hypertension     Family History  Problem Relation Age of Onset  . Allergic rhinitis Mother   . Early death Mother   . Allergic rhinitis Brother   . Hyperlipidemia Brother   . Hearing loss Father   . Heart disease Father   . Hyperlipidemia Father   . Hypertension Father   . Stroke Father   . Hyperlipidemia Sister   . Stroke Maternal Grandfather   . Heart disease Maternal Grandfather   . Stroke Paternal Grandmother     Past Surgical History:  Procedure Laterality Date  . APPENDECTOMY    . CARDIAC CATHETERIZATION  2013   in [place], it was clean  . KNEE ARTHROSCOPY W/ MENISCAL REPAIR Left   . TOTAL KNEE ARTHROPLASTY Right 06/12/2017   Procedure: RIGHT TOTAL KNEE ARTHROPLASTY;  Surgeon: [name], MD;  Location: [facility] OR;  Service: Orthopedics;  Laterality: Right;  . WRIST ARTHROSCOPY Right    "separated wrist"   Social History   Occupational History  . Occupation: Chief of Staff: [employer]  Tobacco Use  . Smoking status: Never Smoker  . Smokeless tobacco: Never Used  Substance and Sexual Activity  . Alcohol use: Yes    Alcohol/week: 2.4 oz    Types: 4 Shots of liquor per week  . Drug use: No  . Sexual activity: Not on file

## 2018-04-22 ENCOUNTER — Ambulatory Visit: Payer: Self-pay | Admitting: Family Medicine

## 2018-04-22 NOTE — Telephone Encounter (Signed)
FYI

## 2018-04-22 NOTE — Telephone Encounter (Signed)
Patient is calling to report that he has been having frequent headaches for 3 weeks. He states the headaches start in the forehead and effect his eyes bilaterally. Patient does get nauseous and dizzy the longer the headache persist.  Reason for Disposition . [1] MODERATE headache (e.g., interferes with normal activities) AND [2] present > 24 hours AND [3] unexplained  (Exceptions: analgesics not tried, typical migraine, or headache part of viral illness)  Answer Assessment - Initial Assessment Questions 1. LOCATION: "Where does it hurt?"      Forehead- middle to the eyes 2. ONSET: "When did the headache start?" (Minutes, hours or days)      3 weeks ago 3. PATTERN: "Does the pain come and go, or has it been constant since it started?"     Comes and goes- patient states he has trouble seeing when he has the headache 4. SEVERITY: "How bad is the pain?" and "What does it keep you from doing?"  (e.g., Scale 1-10; mild, moderate, or severe)   - MILD (1-3): doesn't interfere with normal activities    - MODERATE (4-7): interferes with normal activities or awakens from sleep    - SEVERE (8-10): excruciating pain, unable to do any normal activities        5 5. RECURRENT SYMPTOM: "Have you ever had headaches before?" If so, ask: "When was the last time?" and "What happened that time?"      Not this bad- it has been a few months, sitting working- started getting really bad headache- tends to get a little shaky with hands- left hand- history of concussion that effected that side 6. CAUSE: "What do you think is causing the headache?"     No clue 7. MIGRAINE: "Have you been diagnosed with migraine headaches?" If so, ask: "Is this headache similar?"      no 8. HEAD INJURY: "Has there been any recent injury to the head?"      2018- head injury- concussion 9. OTHER SYMPTOMS: "Do you have any other symptoms?" (fever, stiff neck, eye pain, sore throat, cold symptoms)     Eye distortion, nausea, left arm  shaky 10. PREGNANCY: "Is there any chance you are pregnant?" "When was your last menstrual period?"       n/a  Protocols used: HEADACHE-A-AH

## 2018-04-23 ENCOUNTER — Encounter: Payer: Self-pay | Admitting: Family Medicine

## 2018-04-23 ENCOUNTER — Other Ambulatory Visit: Payer: Self-pay | Admitting: Family Medicine

## 2018-04-23 ENCOUNTER — Telehealth: Payer: Self-pay

## 2018-04-23 ENCOUNTER — Telehealth: Payer: Self-pay | Admitting: Family Medicine

## 2018-04-23 ENCOUNTER — Other Ambulatory Visit: Payer: Self-pay

## 2018-04-23 ENCOUNTER — Ambulatory Visit (INDEPENDENT_AMBULATORY_CARE_PROVIDER_SITE_OTHER): Payer: BLUE CROSS/BLUE SHIELD | Admitting: Family Medicine

## 2018-04-23 VITALS — BP 122/78 | HR 69 | Temp 98.6°F | Ht 68.0 in | Wt 353.8 lb

## 2018-04-23 DIAGNOSIS — H532 Diplopia: Secondary | ICD-10-CM | POA: Diagnosis not present

## 2018-04-23 DIAGNOSIS — R519 Headache, unspecified: Secondary | ICD-10-CM

## 2018-04-23 DIAGNOSIS — R700 Elevated erythrocyte sedimentation rate: Secondary | ICD-10-CM

## 2018-04-23 DIAGNOSIS — H538 Other visual disturbances: Secondary | ICD-10-CM | POA: Diagnosis not present

## 2018-04-23 DIAGNOSIS — R51 Headache: Principal | ICD-10-CM

## 2018-04-23 LAB — SEDIMENTATION RATE: Sed Rate: 50 mm/hr — ABNORMAL HIGH (ref 0–20)

## 2018-04-23 MED ORDER — PREDNISONE 20 MG PO TABS: 60.0000 mg | ORAL_TABLET | Freq: Every day | ORAL | 0 refills | Status: DC

## 2018-04-23 MED ORDER — ONDANSETRON HCL 4 MG/2ML IJ SOLN
4.0000 mg | Freq: Once | INTRAMUSCULAR | Status: AC
  Administered 2018-04-23: 4 mg via INTRAMUSCULAR

## 2018-04-23 MED ORDER — KETOROLAC TROMETHAMINE 60 MG/2ML IM SOLN
60.0000 mg | Freq: Once | INTRAMUSCULAR | Status: AC
  Administered 2018-04-23: 60 mg via INTRAMUSCULAR

## 2018-04-23 NOTE — Telephone Encounter (Signed)
Copied from CRM [id number]. Topic: General - Other >> Apr 23, 2018  2:36 PM [name] Carroll, Ja-Kwan wrote: Reason for CRM: Pt states he spoke with Imaging and he can not be seen until next Wednesday 04/30/18. Cb# [phone]

## 2018-04-23 NOTE — Telephone Encounter (Signed)
Spoke to pt and informed him that he needs to see ENT first and then we will decide if he needs to do MRI. Pt verbalized understanding.

## 2018-04-23 NOTE — Progress Notes (Addendum)
[name] Carroll - 62 y.o. male MRN [id number]  Date of birth: Jun 23, 1956  Subjective Chief Complaint  Patient presents with  . Headache    headache since 3 pm yesterday, blurred vision, dizzy, disorientation. Been having these type of headaches for about 3 months.    HPI [name] Carroll is a 62 y.o. male with history of HTN and concussion here today with complaint of headache.  He tells me that he began having headaches a little over three months ago. Up until 3 months ago he did not have any problems with headaches and no history of migraines.  He did suffer a concussion with LOC a little over a year ago with prolonged recovery but had been doing well up until a few months ago.   He is having a headaches now once per week with each episode lasting 1-2 days.  Headaches start in the center of his head with bitemporal radiation.  He also has light sensitivity and nausea.  He also reports blurred and double vision.  His most recent episode began yesterday and has persisted to today. He has tried tylenol but has not gotten relief from this.  He denies any weakness, slurred speech, tinnitus, difficulty swallowing.  He does have some tingling at times in his L arm if raising his arm too long that has been going on for about 1 year.    ROS:  A comprehensive ROS was completed and negative except as noted per HPI  Allergies  Allergen Reactions  . Penicillins Hives    Swelling up/anything mold base  . Sulfa Antibiotics Hives    Past Medical History:  Diagnosis Date  . Arthritis    "knees, wrists, knuckles" (06/12/2017)  . Concussion 12/2016   "hit head on shelf"  . High cholesterol   . History of kidney stones   . Hypertension     Past Surgical History:  Procedure Laterality Date  . APPENDECTOMY    . CARDIAC CATHETERIZATION  2013   in [place], it was clean  . KNEE ARTHROSCOPY W/ MENISCAL REPAIR Left   . TOTAL KNEE ARTHROPLASTY Right 06/12/2017   Procedure: RIGHT TOTAL KNEE ARTHROPLASTY;   Surgeon: Newt Minion, MD;  Location: [facility];  Service: Orthopedics;  Laterality: Right;  . WRIST ARTHROSCOPY Right    "separated wrist"    Social History   Socioeconomic History  . Marital status: Married    Spouse name: Not on file  . Number of children: Not on file  . Years of education: Not on file  . Highest education level: Not on file  Occupational History  . Occupation: Magazine features editor: [employer]  . Financial resource strain: Not on file  . Food insecurity:    Worry: Not on file    Inability: Not on file  . Transportation needs:    Medical: Not on file    Non-medical: Not on file  Tobacco Use  . Smoking status: Never Smoker  . Smokeless tobacco: Never Used  Substance and Sexual Activity  . Alcohol use: Yes    Alcohol/week: 2.4 oz    Types: 4 Shots of liquor per week  . Drug use: No  . Sexual activity: Not on file  Lifestyle  . Physical activity:    Days per week: Not on file    Minutes per session: Not on file  . Stress: Not on file  Relationships  . Social connections:    Talks on phone: Not on file  Gets together: Not on file    Attends religious service: Not on file    Active member of club or organization: Not on file    Attends meetings of clubs or organizations: Not on file    Relationship status: Not on file  Other Topics Concern  . Not on file  Social History Narrative   Lives w/ wife in [place].    Family History  Problem Relation Age of Onset  . Allergic rhinitis Mother   . Early death Mother   . Allergic rhinitis Brother   . Hyperlipidemia Brother   . Hearing loss Father   . Heart disease Father   . Hyperlipidemia Father   . Hypertension Father   . Stroke Father   . Hyperlipidemia Sister   . Stroke Maternal Grandfather   . Heart disease Maternal Grandfather   . Stroke Paternal Grandmother     Health Maintenance  Topic Date Due  . Hepatitis C Screening  [birth year]  . COLONOSCOPY  02/04/2008  . INFLUENZA  VACCINE  04/17/2018  . TETANUS/TDAP  01/17/2028  . HIV Screening  Completed    ----------------------------------------------------------------------------------------------------------------------------------------------------------------------------------------------------------------- Physical Exam BP 122/78 (BP Location: Left Arm, Patient Position: Sitting, Cuff Size: Large)   Pulse 69   Temp 98.6 F (37 C) (Oral)   Ht 5' 8"  (1.727 m)   Wt (!) 353 lb 12.8 oz (160.5 kg)   SpO2 94%   BMI 53.80 kg/m   Physical Exam  Constitutional: He is oriented to person, place, and time. He appears well-nourished. He does not appear ill. No distress.  HENT:  Head: Normocephalic and atraumatic.  Temporal area is TTP bilaterally.   Eyes: Pupils are equal, round, and reactive to light. EOM are normal. No scleral icterus. Right eye exhibits normal extraocular motion and no nystagmus. Left eye exhibits normal extraocular motion and no nystagmus.  Sensitive to light   Neck: Normal range of motion. Neck supple. No tracheal deviation present.  No carotid bruit   Cardiovascular: Normal rate, regular rhythm and normal heart sounds.  Pulmonary/Chest: Effort normal and breath sounds normal.  Neurological: He is alert and oriented to person, place, and time. He has normal strength. He is not disoriented. No cranial nerve deficit. He displays a negative Romberg sign. Coordination and gait normal.  Skin: Skin is warm and dry. No rash noted.  Psychiatric: He has a normal mood and affect. His behavior is normal.    ------------------------------------------------------------------------------------------------------------------------------------------------------------------------------------------------------------------- Assessment and Plan  Intractable episodic headache New onset headaches with blurred vision and diplopia. He is up to date on eye exam, last completed in 12/2017.  Bitemporal tenderness,  check ESR Neuro exam is reassuring however given age and new onset I have ordered neuroimaging as well.  This may be canceled if ESR returns significantly elevated. No indications for emergent imaging at this time.  Toradol and zofran given for acute headache today.   Addendum:  ESR elevated at 50.  Given this an other symptoms I have placed urgent referral to ENT for temporal bx.  Start prednisone 20m daily.  Will hold off on MRI until after seen by ENT.

## 2018-04-23 NOTE — Telephone Encounter (Signed)
Spoke withpt and informed him per Dr Ashley RoyaltyMatthews, that it is ok for him to be seen that far out, just continue taking the medication he prescribed.

## 2018-04-23 NOTE — Patient Instructions (Signed)

## 2018-04-23 NOTE — Addendum Note (Signed)
Addended by: Mammie LorenzoMATTHEWS, [facility] E on: 04/23/2018 04:01 PM   Modules accepted: Orders

## 2018-04-23 NOTE — Telephone Encounter (Signed)
Given the finding of his elevated sed rate I would like to hold off on the MRI until he is seen by ENT and has temporal artery evaluated.  If this is biopsied and found to abnormal he shouldn't need the MRI.  I am going to cancel this order for now.  If biopsy is negative I will re-order.

## 2018-04-23 NOTE — Telephone Encounter (Signed)
Copied from CRM [id number]. Topic: Quick Communication - See Telephone Encounter >> Apr 23, 2018  3:44 PM Arlyss Gandyichardson, Judi Jaffe N, NT wrote: CRM for notification. See Telephone encounter for: 04/23/18. [facility] calling to see if orders for 2 xrays can be put in for his left knee and an xray order for his eyes to clear him before he is set up for his MRI. CB#: [phone]

## 2018-04-23 NOTE — Telephone Encounter (Signed)
Update

## 2018-04-23 NOTE — Progress Notes (Signed)
Sed rate is elevated.  This along with headaches and vision change is concerning for inflammation of temporal artery. I have placed an urgent referral to ENT to evaluate for biopsy of temporal artery.   -I am sending a prescription for prednisone as well that I want him to start today.

## 2018-04-23 NOTE — Assessment & Plan Note (Addendum)
New onset headaches with blurred vision and diplopia. He is up to date on eye exam, last completed in 12/2017.  Bitemporal tenderness, check ESR Neuro exam is reassuring however given age and new onset I have ordered neuroimaging as well.  This may be canceled if ESR returns significantly elevated. No indications for emergent imaging at this time.  Toradol and zofran given for acute headache today.   Addendum:  ESR elevated at 50.  Given this an other symptoms I have placed urgent referral to ENT for temporal bx.  Start prednisone 44m daily.  Will hold off on MRI until after seen by ENT.

## 2018-04-24 ENCOUNTER — Telehealth: Payer: Self-pay | Admitting: Family Medicine

## 2018-04-24 NOTE — Telephone Encounter (Signed)
Please advise, Dr. Ashley RoyaltyMatthews is out of the office

## 2018-04-24 NOTE — Telephone Encounter (Signed)
Copied from CRM [id number]. Topic: Quick Communication - See Telephone Encounter >> Apr 24, 2018  4:24 PM Arlyss Gandyichardson, Darald Uzzle N, NT wrote: CRM for notification. See Telephone encounter for: 04/24/18. Pt would like see if something for his headaches can be ordered? Requesting call back.

## 2018-04-25 ENCOUNTER — Other Ambulatory Visit: Payer: Self-pay

## 2018-04-25 NOTE — Telephone Encounter (Signed)
Is a work note for 04/23/2018 - 04/25/2018 ok?

## 2018-04-25 NOTE — Telephone Encounter (Signed)
Yes

## 2018-04-25 NOTE — Telephone Encounter (Signed)
Patient checking status. Missed work on 04/23/17 and today due to headaches. Able to write work excuse? CB# [phone]

## 2018-04-25 NOTE — Telephone Encounter (Signed)
Chart reviewed, including negative ct of head in 4/18. BP controlled.  Elevated esr/ ent referral for possible bx.   Okay for days off.  Prednisone 49m 1 po bid for 7 days. NR  FU with Dr MRodena Pietynext week.

## 2018-04-25 NOTE — Telephone Encounter (Signed)
Pt aware of all instructions, I emailed pt work note per pt request.

## 2018-04-28 ENCOUNTER — Telehealth: Payer: Self-pay

## 2018-04-28 NOTE — Telephone Encounter (Signed)
CM-I gave this patient the information for the urgent referral that was faxed over [facility]/I was looking at his lab and his glucose has been quite high the last couple times and I was wondering if it would be appropriate to get him to come in for a lab visit for an A1C? Plz advise/thx dmf

## 2018-04-28 NOTE — Telephone Encounter (Signed)
Previous glucose were from non-fasting labs.   Reasonable to check A1c at next follow up but pressing issue currently is new onset headaches with blurred vision and elevated ESR.  Thanks!

## 2018-04-29 DIAGNOSIS — R51 Headache: Secondary | ICD-10-CM | POA: Diagnosis not present

## 2018-04-29 NOTE — Telephone Encounter (Signed)
Thank you for the extra knowledge/thx dmf

## 2018-05-01 ENCOUNTER — Telehealth: Payer: Self-pay | Admitting: Neurology

## 2018-05-01 ENCOUNTER — Encounter: Payer: Self-pay | Admitting: Family Medicine

## 2018-05-01 ENCOUNTER — Ambulatory Visit (INDEPENDENT_AMBULATORY_CARE_PROVIDER_SITE_OTHER): Payer: BLUE CROSS/BLUE SHIELD | Admitting: Family Medicine

## 2018-05-01 ENCOUNTER — Ambulatory Visit (INDEPENDENT_AMBULATORY_CARE_PROVIDER_SITE_OTHER): Payer: BLUE CROSS/BLUE SHIELD | Admitting: Neurology

## 2018-05-01 ENCOUNTER — Encounter: Payer: Self-pay | Admitting: Neurology

## 2018-05-01 VITALS — BP 105/75 | HR 74 | Ht 68.0 in | Wt 359.0 lb

## 2018-05-01 VITALS — BP 118/60 | HR 106 | Temp 98.6°F | Ht 68.0 in | Wt 353.0 lb

## 2018-05-01 DIAGNOSIS — R51 Headache: Secondary | ICD-10-CM

## 2018-05-01 DIAGNOSIS — R519 Headache, unspecified: Secondary | ICD-10-CM | POA: Insufficient documentation

## 2018-05-01 MED ORDER — NORTRIPTYLINE HCL 25 MG PO CAPS: 50.0000 mg | ORAL_CAPSULE | Freq: Every day | ORAL | 11 refills | Status: DC

## 2018-05-01 MED ORDER — SUMATRIPTAN SUCCINATE 50 MG PO TABS: 50.0000 mg | ORAL_TABLET | ORAL | 6 refills | Status: DC | PRN

## 2018-05-01 MED ORDER — RIZATRIPTAN BENZOATE 10 MG PO TABS: ORAL_TABLET | ORAL | 11 refills | Status: AC

## 2018-05-01 NOTE — Addendum Note (Signed)
Addended by: Lindell SparKIRKMAN, MICHELLE C on: 05/01/2018 03:13 PM   Modules accepted: Orders

## 2018-05-01 NOTE — Assessment & Plan Note (Addendum)
Continues to have bitemporal headache, my concern remains about temporal arteritis given elevated ESR.  Spoke with ENT this afternoon who would like to hold off on biopsy until MRI results return ESR recheck from neuro is still pending.   He is being weaned off of prednisone at this time by neuro and nortriptyline and triptan have been ordered.  We also discussed possibility of OSA contributing to headaches however I think we need to rule out other causes first.   >25 minutes spent with patient with >50% of that time spent coordinating care for patient in discussion with ENT and reviewing plan as recommended by specialists with patient.

## 2018-05-01 NOTE — Progress Notes (Signed)
PATIENT: [name] Carroll DOB: [birth year]  Chief Complaint  Patient presents with  . New Patient (Initial Visit)    Pt reports dizziness and headaches, nausea and some numbness on his left side. This started about 2 weeks ago. Blood work showed elevated Sed Rate.      HISTORICAL  [name] Carroll is a 62 years old male, seen in request by her ENT Dr. Izora Gala for evaluation of dizziness, headaches. Initial evaluation was on May 01 2018. His primary care physician is Dr. Zigmund [name], [facility].  He has past medical history of hypertension, hyperlipidemia, obesity  He denies previous history of significant headaches, since beginning of August 2019, without clear triggers, he began to have headaches, quickly becomes daily headaches involving the vertex, bilateral frontal, temporal region, pressure, sometimes exacerbated to a more severe pounding headache with associated dizziness, seeing sparkling spots in his visual field, with 8 out of 10 headaches,   he was seen by his primary care physician, laboratory evaluations on April 23 2018, ESR was elevated 50, vitamin D was 25, elevated glucose 134, creatinine level was 1.38, normal TSH 0.378, with  elevated ESR of 50, he was given prednisone 10 mg 6 tablets since April 24, 2018 for concern of possible temporal arteritis, he complains of GI upset, jittery, there was no significant improvement in his headache, he has been taking alternating Tylenol and ibuprofen every 8 hours with limited help,  CT head without contrast in April 2018, no acute intracranial abnormality, evidence of left maxillary sinusitis,  He denies a previous history of heart attack and stroke  REVIEW OF SYSTEMS: Full 14 system review of systems performed and notable only for blurred vision, ringing the ears, trouble swallowing, feeling hot, shortness of breath, allergy, headaches, numbness, weakness difficulty swallowing, dizziness, insomnia, snoring  ALLERGIES: Allergies    Allergen Reactions  . Penicillins Hives    Swelling up/anything mold base  . Sulfa Antibiotics Hives    HOME MEDICATIONS: Current Outpatient Medications  Medication Sig Dispense Refill  . aspirin 81 MG tablet     . azelastine (ASTELIN) 0.1 % nasal spray Place 2 sprays into both nostrils at bedtime. Use in each nostril as directed (Patient taking differently: Place 1 spray into both nostrils 2 (two) times daily. ) 90 mL 2  . cyclobenzaprine (FLEXERIL) 10 MG tablet Take 1 tablet (10 mg total) by mouth at bedtime. 30 tablet 0  . ezetimibe-simvastatin (VYTORIN) 10-40 MG tablet Take 1 tablet by mouth at bedtime. 90 tablet 2  . famotidine (PEPCID) 20 MG tablet Take 1 tablet (20 mg total) by mouth at bedtime. 90 tablet 2  . Glucosamine-Chondroitin (OSTEO BI-FLEX REGULAR STRENGTH PO) Take 1 tablet by mouth daily.    Marland Kitchen levocetirizine (XYZAL) 5 MG tablet Take 1 tablet (5 mg total) by mouth at bedtime. 90 tablet 2  . lisinopril (PRINIVIL,ZESTRIL) 10 MG tablet Take 1 tablet (10 mg total) by mouth at bedtime. 90 tablet 2  . Magnesium 500 MG CAPS Take 500 mg by mouth daily.     . Multiple Vitamin (MULTIVITAMIN WITH MINERALS) TABS tablet Take 1 tablet by mouth daily.    . predniSONE (DELTASONE) 20 MG tablet Take 3 tablets (60 mg total) by mouth daily with breakfast. 90 tablet 0  . spironolactone (ALDACTONE) 25 MG tablet Take 1 tablet (25 mg total) by mouth daily. 90 tablet 2  . verapamil (CALAN) 120 MG tablet Take 1 tablet (120 mg total) by mouth 2 (two) times daily. (Patient taking  differently: Take 120 mg by mouth daily. ) 180 tablet 2  . vitamin E 400 UNIT capsule Take 400 Units by mouth daily.    . furosemide (LASIX) 20 MG tablet Take 1 tablet (20 mg total) by mouth as needed for fluid (every other day). 30 tablet 6   No current facility-administered medications for this visit.     PAST MEDICAL HISTORY: Past Medical History:  Diagnosis Date  . Arthritis    "knees, wrists, knuckles" (06/12/2017)   . Concussion 12/2016   "hit head on shelf"  . High cholesterol   . History of kidney stones   . Hypertension     PAST SURGICAL HISTORY: Past Surgical History:  Procedure Laterality Date  . APPENDECTOMY    . CARDIAC CATHETERIZATION  2013   in [place], it was clean  . KNEE ARTHROSCOPY W/ MENISCAL REPAIR Left   . TOTAL KNEE ARTHROPLASTY Right 06/12/2017   Procedure: RIGHT TOTAL KNEE ARTHROPLASTY;  Surgeon: Newt Minion, MD;  Location: [facility];  Service: Orthopedics;  Laterality: Right;  . WRIST ARTHROSCOPY Right    "separated wrist"    FAMILY HISTORY: Family History  Problem Relation Age of Onset  . Allergic rhinitis Mother   . Early death Mother   . Allergic rhinitis Brother   . Hyperlipidemia Brother   . Hearing loss Father   . Heart disease Father   . Hyperlipidemia Father   . Hypertension Father   . Stroke Father   . Hyperlipidemia Sister   . Stroke Maternal Grandfather   . Heart disease Maternal Grandfather   . Stroke Paternal Grandmother     SOCIAL HISTORY: Social History   Socioeconomic History  . Marital status: Married    Spouse name: Not on file  . Number of children: Not on file  . Years of education: Not on file  . Highest education level: Not on file  Occupational History  . Occupation: Magazine features editor: [employer]  . Financial resource strain: Not on file  . Food insecurity:    Worry: Not on file    Inability: Not on file  . Transportation needs:    Medical: Not on file    Non-medical: Not on file  Tobacco Use  . Smoking status: Never Smoker  . Smokeless tobacco: Never Used  Substance and Sexual Activity  . Alcohol use: Yes    Alcohol/week: 4.0 standard drinks    Types: 4 Shots of liquor per week  . Drug use: No  . Sexual activity: Not on file  Lifestyle  . Physical activity:    Days per week: Not on file    Minutes per session: Not on file  . Stress: Not on file  Relationships  . Social connections:    Talks on  phone: Not on file    Gets together: Not on file    Attends religious service: Not on file    Active member of club or organization: Not on file    Attends meetings of clubs or organizations: Not on file    Relationship status: Not on file  . Intimate partner violence:    Fear of current or ex partner: Not on file    Emotionally abused: Not on file    Physically abused: Not on file    Forced sexual activity: Not on file  Other Topics Concern  . Not on file  Social History Narrative   Lives w/ wife in [place].  PHYSICAL EXAM   Vitals:   05/01/18 1124  BP: 105/75  Pulse: 74  Weight: (!) 359 lb (162.8 kg)  Height: 5' 8"  (1.727 m)    Not recorded      Body mass index is 54.59 kg/m.  PHYSICAL EXAMNIATION:  Gen: NAD, conversant, well nourised, obese, well groomed                     Cardiovascular: Regular rate rhythm, no peripheral edema, warm, nontender. Eyes: Conjunctivae clear without exudates or hemorrhage Neck: Supple, no carotid bruits. Pulmonary: Clear to auscultation bilaterally   NEUROLOGICAL EXAM:  MENTAL STATUS: Speech:    Speech is normal; fluent and spontaneous with normal comprehension.  Cognition:     Orientation to time, place and person     Normal recent and remote memory     Normal Attention span and concentration     Normal Language, naming, repeating,spontaneous speech     Fund of knowledge   CRANIAL NERVES: CN II: Visual fields are full to confrontation. Fundoscopic exam is normal with sharp discs and no vascular changes. Pupils are round equal and briskly reactive to light. CN III, IV, VI: extraocular movement are normal. No ptosis. CN V: Facial sensation is intact to pinprick in all 3 divisions bilaterally. Corneal responses are intact.  CN VII: Face is symmetric with normal eye closure and smile. CN VIII: Hearing is normal to rubbing fingers CN IX, X: Palate elevates symmetrically. Phonation is normal. CN XI: Head turning and shoulder  shrug are intact CN XII: Tongue is midline with normal movements and no atrophy.  MOTOR: There is no pronator drift of out-stretched arms. Muscle bulk and tone are normal. Muscle strength is normal.  REFLEXES: Reflexes are 2+ and symmetric at the biceps, triceps, knees, and ankles. Plantar responses are flexor.  SENSORY: Intact to light touch, pinprick, positional sensation and vibratory sensation are intact in fingers and toes.  COORDINATION: Rapid alternating movements and fine finger movements are intact. There is no dysmetria on finger-to-nose and heel-knee-shin.    GAIT/STANCE: Posture is normal. Gait is steady with normal steps, base, arm swing, and turning. Heel and toe walking are normal. Tandem gait is normal.  Romberg is absent.   DIAGNOSTIC DATA (LABS, IMAGING, TESTING) - I reviewed patient records, labs, notes, testing and imaging myself where available.   ASSESSMENT AND PLAN  [name] Carroll is a 62 y.o. male   New onset headaches  MRI of brain to rule out structural lesion  Repeat ESR C-reactive protein,  Isolated elevated ESR 50,In the setting of chronic maxillary sinusitis, not enough evidence to support a diagnosis of temporal arteritis, in addition, he denies jaw claudication, whole body achy pain,  Proceed with nortriptyline 25 mg titrating to 50 mg every night as migraine prevention,  Maxalt as needed    Marcial Pacas, M.D. Ph.D.  [facility] [address] [place], [place] [address] Ph: [phone] Fax: [phone]  CC: Izora Gala, MD

## 2018-05-01 NOTE — Addendum Note (Signed)
Addended by: Lindell SparKIRKMAN, Sybrina Laning C on: 05/01/2018 03:22 PM   Modules accepted: Orders

## 2018-05-01 NOTE — Patient Instructions (Signed)
Tapering off prednisone  50mg  x3 days 40mg  x3 days 30mg  x3 days 20mg  x 3 days,   10mg  daily

## 2018-05-01 NOTE — Telephone Encounter (Signed)
Bryan from [facility] called and stated that the patient was at the pharmacy earlier and the patient is allergic to an Imitrex prescribed to him today. The patient tried to fill the medication and the pharmacy made him aware that there was an allergy listed on record, they would like to get the medication changed. He is allergic to sulfa drugs so Judie GrieveBryan would like to know if it can be changed to Maxalt. Please call and advise.

## 2018-05-01 NOTE — Addendum Note (Signed)
Addended by: Lindell SparKIRKMAN, MICHELLE C on: 05/01/2018 03:24 PM   Modules accepted: Orders

## 2018-05-01 NOTE — Progress Notes (Signed)
[name] Carroll - 62 y.o. male MRN [id number]  Date of birth: [birth year]  Subjective Chief Complaint  Patient presents with  . Follow-up    HPI [name] Carroll is a 62 y.o. male with history of concussion and arthritis here today for follow up of headache.  I last saw him about a week ago for bitemporal headaches associated with some vision changes and diplopia.  His ESR was elevated at that time and out of concern for temporal arteritis I started him on prednisone and placed referral to ENT for temporal artery biopsy.  I reviewed that recent note from ENT however there was no mention of possible temporal arteritis and he was referred for MRI and to neurology.  I spoke with Dr. Constance Holster (ENT)  today  and discussed elevated ESR.  He wanted to proceed with MRI and neuro referral and if these were normal go ahead and do temporal artery biopsy.  He was seen by neurology today and he tells me that she is unsure of exact cause and that migraine may be a possibility.  She is re-checking ESR and CRP and he tells me that she told him that sinus infection could be cause of elevated ESR.  He was prescribed triptan medication and nortriptyline, he has not started these yet.  He is being weaned from prednisone by neurologist.   He continues to have the same symptoms he had at visit last week.   ROS:  A comprehensive ROS was completed and negative except as noted per HPI  Allergies  Allergen Reactions  . Penicillins Hives    Swelling up/anything mold base  . Sulfa Antibiotics Hives  . Sumatriptan     Past Medical History:  Diagnosis Date  . Arthritis    "knees, wrists, knuckles" (06/12/2017)  . Concussion 12/2016   "hit head on shelf"  . High cholesterol   . History of kidney stones   . Hypertension     Past Surgical History:  Procedure Laterality Date  . APPENDECTOMY    . CARDIAC CATHETERIZATION  2013   in [place], it was clean  . KNEE ARTHROSCOPY W/ MENISCAL REPAIR Left   . TOTAL KNEE ARTHROPLASTY  Right 06/12/2017   Procedure: RIGHT TOTAL KNEE ARTHROPLASTY;  Surgeon: Newt Minion, MD;  Location: [facility];  Service: Orthopedics;  Laterality: Right;  . WRIST ARTHROSCOPY Right    "separated wrist"    Social History   Socioeconomic History  . Marital status: Married    Spouse name: Not on file  . Number of children: Not on file  . Years of education: Not on file  . Highest education level: Not on file  Occupational History  . Occupation: Magazine features editor: [employer]  . Financial resource strain: Not on file  . Food insecurity:    Worry: Not on file    Inability: Not on file  . Transportation needs:    Medical: Not on file    Non-medical: Not on file  Tobacco Use  . Smoking status: Never Smoker  . Smokeless tobacco: Never Used  Substance and Sexual Activity  . Alcohol use: Yes    Alcohol/week: 4.0 standard drinks    Types: 4 Shots of liquor per week  . Drug use: No  . Sexual activity: Not on file  Lifestyle  . Physical activity:    Days per week: Not on file    Minutes per session: Not on file  . Stress: Not on file  Relationships  . Social connections:    Talks on phone: Not on file    Gets together: Not on file    Attends religious service: Not on file    Active member of club or organization: Not on file    Attends meetings of clubs or organizations: Not on file    Relationship status: Not on file  Other Topics Concern  . Not on file  Social History Narrative   Lives w/ wife in [place].    Family History  Problem Relation Age of Onset  . Allergic rhinitis Mother   . Early death Mother   . Allergic rhinitis Brother   . Hyperlipidemia Brother   . Hearing loss Father   . Heart disease Father   . Hyperlipidemia Father   . Hypertension Father   . Stroke Father   . Hyperlipidemia Sister   . Stroke Maternal Grandfather   . Heart disease Maternal Grandfather   . Stroke Paternal Grandmother     Health Maintenance  Topic Date Due  .  Hepatitis C Screening  1956/06/28  . COLONOSCOPY  02/04/2008  . INFLUENZA VACCINE  04/17/2018  . TETANUS/TDAP  01/17/2028  . HIV Screening  Completed    ----------------------------------------------------------------------------------------------------------------------------------------------------------------------------------------------------------------- Physical Exam BP 118/60 (BP Location: Left Arm, Patient Position: Sitting, Cuff Size: Large)   Pulse (!) 106   Temp 98.6 F (37 C) (Oral)   Ht 5' 8"  (1.727 m)   Wt (!) 353 lb (160.1 kg)   SpO2 97%   BMI 53.67 kg/m   Physical Exam  Constitutional: He appears well-nourished. No distress.  Skin: Skin is warm and dry.  Psychiatric: He has a normal mood and affect. His behavior is normal.    ------------------------------------------------------------------------------------------------------------------------------------------------------------------------------------------------------------------- Assessment and Plan  New onset headache Continues to have bitemporal headache, my concern remains about temporal arteritis given elevated ESR.  Spoke with ENT this afternoon who would like to hold off on biopsy until MRI results return ESR recheck from neuro is still pending.   He is being weaned off of prednisone at this time by neuro and nortriptyline and triptan have been ordered.  We also discussed possibility of OSA contributing to headaches however I think we need to rule out other causes first.   >25 minutes spent with patient with >50% of that time spent coordinating care for patient in discussion with ENT and reviewing plan as recommended by specialists with patient.

## 2018-05-01 NOTE — Telephone Encounter (Signed)
[employer] [place] Ref # [id number] order sent to GI. They will reach out to the pt to schedule.

## 2018-05-01 NOTE — Telephone Encounter (Addendum)
Per vo by Dr. Terrace ArabiaYan, okay to provide rx for rizatriptan 10mg , take 1 tab at onset of migraine.  May repeat in 2 hrs, if needed.  Max dose: 2 tabs/day. This is a 30 day prescription.  No early refills.  New rx sent to pharmacy with note to void sumatriptan refills.  Sumatriptan added to allergy list.

## 2018-05-02 ENCOUNTER — Telehealth: Payer: Self-pay | Admitting: Neurology

## 2018-05-02 LAB — SEDIMENTATION RATE: SED RATE: 22 mm/h (ref 0–30)

## 2018-05-02 LAB — C-REACTIVE PROTEIN

## 2018-05-02 NOTE — Telephone Encounter (Signed)
Please call patient, laboratory evaluation showed normal ESR 22, with normal being less than 30, C-reactive protein less than 1.  There is no evidence to support a diagnosis of temporal arteritis at this point, he should continue with tapering off his prednisone as laid out during his office visit.  He should completely stop his prednisone according to the tapering schedule.

## 2018-05-02 NOTE — Telephone Encounter (Signed)
Spoke with Fredrik Coveoger and reviewed below lab results and advised he should continue with Prednisone taper as discussed during ov.  He verbalized understanding of same/fim

## 2018-05-08 DIAGNOSIS — R51 Headache: Secondary | ICD-10-CM | POA: Diagnosis not present

## 2018-05-09 ENCOUNTER — Ambulatory Visit
Admission: RE | Admit: 2018-05-09 | Discharge: 2018-05-09 | Disposition: A | Discharge status: Home / Self Care | Payer: BLUE CROSS/BLUE SHIELD | Source: Ambulatory Visit | Attending: Neurology | Admitting: Neurology

## 2018-05-09 ENCOUNTER — Other Ambulatory Visit: Payer: Self-pay | Admitting: Family Medicine

## 2018-05-09 ENCOUNTER — Telehealth: Payer: Self-pay | Admitting: Neurology

## 2018-05-09 ENCOUNTER — Encounter: Payer: Self-pay | Admitting: Family Medicine

## 2018-05-09 DIAGNOSIS — R519 Headache, unspecified: Secondary | ICD-10-CM

## 2018-05-09 DIAGNOSIS — R51 Headache: Secondary | ICD-10-CM | POA: Diagnosis not present

## 2018-05-09 MED ORDER — CLINDAMYCIN HCL 300 MG PO CAPS: 300.0000 mg | ORAL_CAPSULE | Freq: Four times a day (QID) | ORAL | 0 refills | Status: AC

## 2018-05-09 NOTE — Telephone Encounter (Signed)
Left patient a detailed message, with results, on voicemail (ok per DPR).  Provided our number to call back with any questions.   Also, requested a phone call back to let [name] Carroll know how his medication is working for his headaches.   Included in the message to keep his appt with Dr. Terrace ArabiaYan and to see his ENT.

## 2018-05-09 NOTE — Telephone Encounter (Signed)
Please call patient, MRI of the brain was normal, showed evidence of left maxillary sinusitis, which was also seen on previous CAT scan in April 2018, there was also evidence of mild left mastoid effusion, also check to see if his headache has improved with current treatment, keep follow-up appointment  Also make sure he continue his treatment and follow-up with his ENT physician     IMPRESSION: This MRI of the brain without contrast shows the following: 1.    The brain appears normal for age. 2.    There is opacification of the left maxillary sinus likely due to sinusitis.  This is also seen on the 12/26/2016 CT scan. 3.    There is a mild left mastoid effusion.  This is not noted on the 12/26/2016 CT scan.  It is likely due to mild eustachian tube dysfunction.

## 2018-05-12 ENCOUNTER — Encounter: Payer: Self-pay | Admitting: Family Medicine

## 2018-05-15 DIAGNOSIS — H6123 Impacted cerumen, bilateral: Secondary | ICD-10-CM | POA: Diagnosis not present

## 2018-05-15 DIAGNOSIS — R51 Headache: Secondary | ICD-10-CM | POA: Diagnosis not present

## 2018-05-16 ENCOUNTER — Encounter: Payer: Self-pay | Admitting: Family Medicine

## 2018-05-21 ENCOUNTER — Ambulatory Visit (INDEPENDENT_AMBULATORY_CARE_PROVIDER_SITE_OTHER): Payer: BLUE CROSS/BLUE SHIELD | Admitting: Neurology

## 2018-05-21 ENCOUNTER — Encounter: Payer: Self-pay | Admitting: Neurology

## 2018-05-21 ENCOUNTER — Telehealth: Payer: Self-pay | Admitting: *Deleted

## 2018-05-21 VITALS — BP 108/70 | HR 106 | Ht 68.0 in | Wt 354.0 lb

## 2018-05-21 DIAGNOSIS — R51 Headache: Secondary | ICD-10-CM

## 2018-05-21 DIAGNOSIS — G8929 Other chronic pain: Secondary | ICD-10-CM

## 2018-05-21 MED ORDER — DIVALPROEX SODIUM ER 500 MG PO TB24: 500.0000 mg | ORAL_TABLET | Freq: Every day | ORAL | 11 refills | Status: AC

## 2018-05-21 MED ORDER — NORTRIPTYLINE HCL 25 MG PO CAPS: 50.0000 mg | ORAL_CAPSULE | Freq: Every day | ORAL | 4 refills | Status: AC

## 2018-05-21 MED ORDER — FREMANEZUMAB-VFRM 225 MG/1.5ML ~~LOC~~ SOSY: 225.0000 mg | PREFILLED_SYRINGE | SUBCUTANEOUS | 11 refills | Status: AC

## 2018-05-21 NOTE — Progress Notes (Signed)
PATIENT: [name] Carroll DOB: [birth year]  Chief Complaint  Patient presents with  . Headache    He is here with his wife, [name].  No change in headaches with nortriptyline and Prednisone.  He cut his nortriptyline dose down to 56m at bedtime.  The 518mdosage did not help his headaches and caused him to feel drowsy the following day.      HISTORICAL  [name] Carroll a 62years old male, seen in request by her ENT Dr. RoIzora Carroll evaluation of dizziness, headaches. Initial evaluation was on May 01 2018. His primary care physician is Dr. [name] He has past medical history of hypertension, hyperlipidemia, obesity  He denies previous history of significant headaches, since beginning of August 2019, without clear triggers, he began to have headaches, quickly becomes daily headaches involving the vertex, bilateral frontal, temporal region, pressure, sometimes exacerbated to a more severe pounding headache with associated dizziness, seeing sparkling spots in his visual field, with 8 out of 10 headaches,   he was seen by his primary care physician, laboratory evaluations on April 23 2018, ESR was elevated 50, vitamin D was 25, elevated glucose 134, creatinine level was 1.38, normal TSH 0.378, with  elevated ESR of 50, he was given prednisone 10 mg 6 tablets since April 24, 2018 for concern of possible temporal arteritis, he complains of GI upset, jittery, there was no significant improvement in his headache, he has been taking alternating Tylenol and ibuprofen every 8 hours with limited help,  CT head without contrast in April 2018, no acute intracranial abnormality, evidence of left maxillary sinusitis,  He denies a previous history of heart attack and stroke  UPDATE Sept 4 2019: Repeat laboratory evaluation showed follow-up not enough evidence to support a diagnosis of arthritis, medicine did not any difference in his headache anyway, it was tapered off,  Personally  reviewed MRI of the brain without contrast on May 09, 2018, there is calcification of left maxillary sinus, mild left mastoid effusion,  He continue complains of daily moderate to severe bilateral temporal region pressure headaches, has been taking alternating Tylenol with ibuprofen every 8 hours, nortriptyline 25 mg every night has helped his headaches, 2 tablets because next morning drowsiness, he is only taking 25 mg, tried Maxalt 10 mg as needed, which has been very helpful, take about 30 minutes to take away his headache, the benefit last about 6 hours,  He is also given clindamycin for treatment of left maxillary sinusitis,   REVIEW OF SYSTEMS: Full 14 system review of systems performed and notable only for excessive sweating, ringing the ears, trouble swallowing, light sensitivity, cough, nausea, restless leg, insomnia, daytime sleepiness, joint pain, back pain, dizziness, headaches, tremor  ALLERGIES: Allergies  Allergen Reactions  . Penicillins Hives    Swelling up/anything mold base  . Sulfa Antibiotics Hives  . Sumatriptan     HOME MEDICATIONS: Current Outpatient Medications  Medication Sig Dispense Refill  . aspirin 81 MG tablet     . azelastine (ASTELIN) 0.1 % nasal spray Place 2 sprays into both nostrils at bedtime. Use in each nostril as directed (Patient taking differently: Place 1 spray into both nostrils 2 (two) times daily. ) 90 mL 2  . clindamycin (CLEOCIN) 300 MG capsule Take 1 capsule (300 mg total) by mouth 4 (four) times daily for 21 days. 84 capsule 0  . cyclobenzaprine (FLEXERIL) 10 MG tablet Take 1 tablet (10 mg total) by mouth at bedtime. 30 tablet  0  . ezetimibe-simvastatin (VYTORIN) 10-40 MG tablet Take 1 tablet by mouth at bedtime. 90 tablet 2  . famotidine (PEPCID) 20 MG tablet Take 1 tablet (20 mg total) by mouth at bedtime. 90 tablet 2  . Glucosamine-Chondroitin (OSTEO BI-FLEX REGULAR STRENGTH PO) Take 1 tablet by mouth daily.    Marland Kitchen levocetirizine  (XYZAL) 5 MG tablet Take 1 tablet (5 mg total) by mouth at bedtime. 90 tablet 2  . lisinopril (PRINIVIL,ZESTRIL) 10 MG tablet Take 1 tablet (10 mg total) by mouth at bedtime. 90 tablet 2  . Magnesium 500 MG CAPS Take 500 mg by mouth daily.     . Multiple Vitamin (MULTIVITAMIN WITH MINERALS) TABS tablet Take 1 tablet by mouth daily.    . nortriptyline (PAMELOR) 25 MG capsule Take 2 capsules (50 mg total) by mouth at bedtime. 60 capsule 11  . rizatriptan (MAXALT) 10 MG tablet Take 1 tab at onset of migraine.  May repeat in 2 hrs, if needed.  Max dose: 2 tabs/day. This is a 30 day prescription. 12 tablet 11  . spironolactone (ALDACTONE) 25 MG tablet Take 1 tablet (25 mg total) by mouth daily. 90 tablet 2  . verapamil (CALAN) 120 MG tablet Take 1 tablet (120 mg total) by mouth 2 (two) times daily. (Patient taking differently: Take 120 mg by mouth daily. ) 180 tablet 2  . vitamin E 400 UNIT capsule Take 400 Units by mouth daily.    . furosemide (LASIX) 20 MG tablet Take 1 tablet (20 mg total) by mouth as needed for fluid (every other day). 30 tablet 6   No current facility-administered medications for this visit.     PAST MEDICAL HISTORY: Past Medical History:  Diagnosis Date  . Arthritis    "knees, wrists, knuckles" (06/12/2017)  . Concussion 12/2016   "hit head on shelf"  . High cholesterol   . History of kidney stones   . Hypertension     PAST SURGICAL HISTORY: Past Surgical History:  Procedure Laterality Date  . APPENDECTOMY    . CARDIAC CATHETERIZATION  2013   in [place], it was clean  . KNEE ARTHROSCOPY W/ MENISCAL REPAIR Left   . TOTAL KNEE ARTHROPLASTY Right 06/12/2017   Procedure: RIGHT TOTAL KNEE ARTHROPLASTY;  Surgeon: Newt Minion, MD;  Location: [facility];  Service: Orthopedics;  Laterality: Right;  . WRIST ARTHROSCOPY Right    "separated wrist"    FAMILY HISTORY: Family History  Problem Relation Age of Onset  . Allergic rhinitis Mother   . Early death Mother   .  Allergic rhinitis Brother   . Hyperlipidemia Brother   . Hearing loss Father   . Heart disease Father   . Hyperlipidemia Father   . Hypertension Father   . Stroke Father   . Hyperlipidemia Sister   . Stroke Maternal Grandfather   . Heart disease Maternal Grandfather   . Stroke Paternal Grandmother     SOCIAL HISTORY: Social History   Socioeconomic History  . Marital status: Married    Spouse name: Not on file  . Number of children: Not on file  . Years of education: Not on file  . Highest education level: Not on file  Occupational History  . Occupation: Magazine features editor: [employer]  . Financial resource strain: Not on file  . Food insecurity:    Worry: Not on file    Inability: Not on file  . Transportation needs:    Medical: Not on  file    Non-medical: Not on file  Tobacco Use  . Smoking status: Never Smoker  . Smokeless tobacco: Never Used  Substance and Sexual Activity  . Alcohol use: Yes    Alcohol/week: 4.0 standard drinks    Types: 4 Shots of liquor per week  . Drug use: No  . Sexual activity: Not on file  Lifestyle  . Physical activity:    Days per week: Not on file    Minutes per session: Not on file  . Stress: Not on file  Relationships  . Social connections:    Talks on phone: Not on file    Gets together: Not on file    Attends religious service: Not on file    Active member of club or organization: Not on file    Attends meetings of clubs or organizations: Not on file    Relationship status: Not on file  . Intimate partner violence:    Fear of current or ex partner: Not on file    Emotionally abused: Not on file    Physically abused: Not on file    Forced sexual activity: Not on file  Other Topics Concern  . Not on file  Social History Narrative   Lives w/ wife in [place].     PHYSICAL EXAM   Vitals:   05/21/18 1306  BP: 108/70  Pulse: (!) 106  Weight: (!) 354 lb (160.6 kg)  Height: 5' 8"  (1.727 m)    Not recorded        Body mass index is 53.83 kg/m.  PHYSICAL EXAMNIATION:  Gen: NAD, conversant, well nourised, obese, well groomed                     Cardiovascular: Regular rate rhythm, no peripheral edema, warm, nontender. Eyes: Conjunctivae clear without exudates or hemorrhage Neck: Supple, no carotid bruits. Pulmonary: Clear to auscultation bilaterally   NEUROLOGICAL EXAM:  MENTAL STATUS: Speech:    Speech is normal; fluent and spontaneous with normal comprehension.  Cognition:     Orientation to time, place and person     Normal recent and remote memory     Normal Attention span and concentration     Normal Language, naming, repeating,spontaneous speech     Fund of knowledge   CRANIAL NERVES: CN II: Visual fields are full to confrontation. Pupils are round equal and briskly reactive to light. CN III, IV, VI: extraocular movement are normal. No ptosis. CN V: Facial sensation is intact to pinprick in all 3 divisions bilaterally. Corneal responses are intact.  CN VII: Face is symmetric with normal eye closure and smile. CN VIII: Hearing is normal to rubbing fingers CN IX, X: Palate elevates symmetrically. Phonation is normal. CN XI: Head turning and shoulder shrug are intact CN XII: Tongue is midline with normal movements and no atrophy.  MOTOR: Normal tone bulk and strength  REFLEXES: Reflexes are 2+ and symmetric at the biceps, triceps, knees, and ankles. Plantar responses are flexor.  SENSORY: Intact to light touch, pinprick, positional sensation and vibratory sensation are intact in fingers and toes.  COORDINATION: Rapid alternating movements and fine finger movements are intact. There is no dysmetria on finger-to-nose and heel-knee-shin.    GAIT/STANCE: Gait is normal  DIAGNOSTIC DATA (LABS, IMAGING, TESTING) - I reviewed patient records, labs, notes, testing and imaging myself where available.   ASSESSMENT AND PLAN  Emran Molzahn is a 62 y.o. male   New onset  headaches  With migraine  features  Normal ESR C-reactive protein to rule out possibility of temporal arteritis  MRI of brain showed evidence of left maxillary sinusitis, he is now treated with clindamycin,  He could not tolerate higher dose of nortriptyline 50 mg every night, complains of drowsiness the next day, keep 25 mg qhs  Add on Depakote ER 51m qhs as migraine prevention   Maxalt 13mas needed  [place].   YiMarcial PacasM.D. Ph.D.  [facility] [address] [id number]: [phone]: [phone]: [name] GalaMD

## 2018-05-21 NOTE — Telephone Encounter (Signed)
PA for Ajovy started via covermymeds (key: [id number]).  Pt has [employer] coverage.  Decision pending.

## 2018-05-22 ENCOUNTER — Encounter: Payer: Self-pay | Admitting: Family Medicine

## 2018-05-22 ENCOUNTER — Ambulatory Visit (INDEPENDENT_AMBULATORY_CARE_PROVIDER_SITE_OTHER): Payer: BLUE CROSS/BLUE SHIELD | Admitting: Family Medicine

## 2018-05-22 VITALS — BP 90/60 | HR 92 | Temp 98.4°F | Ht 68.0 in | Wt 357.8 lb

## 2018-05-22 DIAGNOSIS — R51 Headache: Secondary | ICD-10-CM

## 2018-05-22 DIAGNOSIS — R42 Dizziness and giddiness: Secondary | ICD-10-CM | POA: Insufficient documentation

## 2018-05-22 DIAGNOSIS — R519 Headache, unspecified: Secondary | ICD-10-CM

## 2018-05-22 NOTE — Assessment & Plan Note (Signed)
Dizziness with diaphoresis EKG completed and no change from previous tracing BP has been low which may be contributing, he will hold lisinopril for now. I asked that he monitor home BP.  He does also have a +Dix Hallpike test and his eustachian tube dysfunction may be playing a role.  He will complete antibiotics and continue use of current nasal sprays.

## 2018-05-22 NOTE — Progress Notes (Signed)
[name] Carroll - 62 y.o. male MRN [id number]  Date of birth: March 05, 1956  Subjective Chief Complaint  Patient presents with  . Follow-up    dizziness and heahaches    HPI [name] Carroll is a 62 y.o. male here today with complaint of dizziness and continued headaches.  He is being followed by neurology and treated for headaches.  Currently treated with nortriptyline and depakote was recently added as well.  Previous MRI showed chronic sinusitis with mastoid effusion as well.  He is taking clindamycin for treatment of this.  Dizziness tends to come and go, sometimes feels that he is going to pass out.  He does feel sweaty at times with minimal activity.  He did have and episode of chest pain in 02/2018, ruled out for ACS and normal cath in 2013.  He denies chest pain or shortness of breath with these symptoms.  He denies fevers or chills.   ROS:  A comprehensive ROS was completed and negative except as noted per HPI  Allergies  Allergen Reactions  . Penicillins Hives    Swelling up/anything mold base  . Sulfa Antibiotics Hives  . Sumatriptan     Past Medical History:  Diagnosis Date  . Arthritis    "knees, wrists, knuckles" (06/12/2017)  . Concussion 12/2016   "hit head on shelf"  . High cholesterol   . History of kidney stones   . Hypertension     Past Surgical History:  Procedure Laterality Date  . APPENDECTOMY    . CARDIAC CATHETERIZATION  2013   in [place], it was clean  . KNEE ARTHROSCOPY W/ MENISCAL REPAIR Left   . TOTAL KNEE ARTHROPLASTY Right 06/12/2017   Procedure: RIGHT TOTAL KNEE ARTHROPLASTY;  Surgeon: [name], MD;  Location: [facility] OR;  Service: Orthopedics;  Laterality: Right;  . WRIST ARTHROSCOPY Right    "separated wrist"    Social History   Socioeconomic History  . Marital status: Married    Spouse name: Not on file  . Number of children: Not on file  . Years of education: Not on file  . Highest education level: Not on file  Occupational History  .  Occupation: Chief of Staff: [employer]  Social Needs  . Financial resource strain: Not on file  . Food insecurity:    Worry: Not on file    Inability: Not on file  . Transportation needs:    Medical: Not on file    Non-medical: Not on file  Tobacco Use  . Smoking status: Never Smoker  . Smokeless tobacco: Never Used  Substance and Sexual Activity  . Alcohol use: Yes    Alcohol/week: 4.0 standard drinks    Types: 4 Shots of liquor per week  . Drug use: No  . Sexual activity: Not on file  Lifestyle  . Physical activity:    Days per week: Not on file    Minutes per session: Not on file  . Stress: Not on file  Relationships  . Social connections:    Talks on phone: Not on file    Gets together: Not on file    Attends religious service: Not on file    Active member of club or organization: Not on file    Attends meetings of clubs or organizations: Not on file    Relationship status: Not on file  Other Topics Concern  . Not on file  Social History Narrative   Lives w/ wife in [place].    Family History  Problem Relation Age of Onset  . Allergic rhinitis Mother   . Early death Mother   . Allergic rhinitis Brother   . Hyperlipidemia Brother   . Hearing loss Father   . Heart disease Father   . Hyperlipidemia Father   . Hypertension Father   . Stroke Father   . Hyperlipidemia Sister   . Stroke Maternal Grandfather   . Heart disease Maternal Grandfather   . Stroke Paternal Grandmother     Health Maintenance  Topic Date Due  . Hepatitis C Screening  27-Aug-1956  . COLONOSCOPY  02/04/2008  . INFLUENZA VACCINE  04/17/2018  . TETANUS/TDAP  01/17/2028  . HIV Screening  Completed    ----------------------------------------------------------------------------------------------------------------------------------------------------------------------------------------------------------------- Physical Exam BP 90/60 (BP Location: Left Arm, Patient Position: Sitting, Cuff  Size: Large)   Pulse 92   Temp 98.4 F (36.9 C) (Oral)   Ht 5\' 8"  (1.727 m)   Wt (!) 357 lb 12.8 oz (162.3 kg)   SpO2 95%   BMI 54.40 kg/m   Physical Exam  Constitutional: He is oriented to person, place, and time. He appears well-nourished. No distress.  HENT:  Head: Normocephalic and atraumatic.  Mouth/Throat: Oropharynx is clear and moist.  Eyes: No scleral icterus.  Neck: Normal range of motion. Neck supple.  Cardiovascular: Normal rate, regular rhythm and normal heart sounds.  Pulmonary/Chest: Effort normal and breath sounds normal.  Neurological: He is alert and oriented to person, place, and time. No cranial nerve deficit.  +Dix hallpike on R  Skin: Skin is warm and dry. No rash noted.  Psychiatric: He has a normal mood and affect.   EKG:  NSR.  Q waves in inferior leads.  Unchanged from previous tracing.  ------------------------------------------------------------------------------------------------------------------------------------------------------------------------------------------------------------------- Assessment and Plan  Dizziness Dizziness with diaphoresis EKG completed and no change from previous tracing BP has been low which may be contributing, he will hold lisinopril for now. I asked that he monitor home BP.  He does also have a +Dix Hallpike test and his eustachian tube dysfunction may be playing a role.  He will complete antibiotics and continue use of current nasal sprays.

## 2018-05-22 NOTE — Patient Instructions (Signed)
Hold lisinopril for now If you notice blood pressure increasing to >140/90, restart.  Continue nasal sprays and complete antibiotic.

## 2018-05-22 NOTE — Assessment & Plan Note (Signed)
Followed by neurology Likely migraine based on history Recently started depakote in addition to nortriptyline.

## 2018-05-23 ENCOUNTER — Ambulatory Visit: Payer: BLUE CROSS/BLUE SHIELD | Admitting: Family Medicine

## 2018-05-30 ENCOUNTER — Encounter: Payer: Self-pay | Admitting: Family Medicine

## 2018-08-27 ENCOUNTER — Telehealth (INDEPENDENT_AMBULATORY_CARE_PROVIDER_SITE_OTHER): Payer: Self-pay

## 2018-08-27 NOTE — Telephone Encounter (Signed)
[name] FanningJulie with Dr. Arva ChafeScherer's office called stating that they are needing medical records for a EKG.

## 2018-08-28 NOTE — Telephone Encounter (Signed)
Unable to do anything-no phone number,no fax number

## 2018-11-08 IMAGING — DX DG CERVICAL SPINE COMPLETE 4+V
5 series · 5 of 5 positions shown · non-contrast
Comparison: None.

CLINICAL DATA: 60 y/o M; concussion 3 days ago with persistent
headaches and neck stiffness.

EXAM:
CERVICAL SPINE - COMPLETE 4+ VIEW

[c-spine lat]
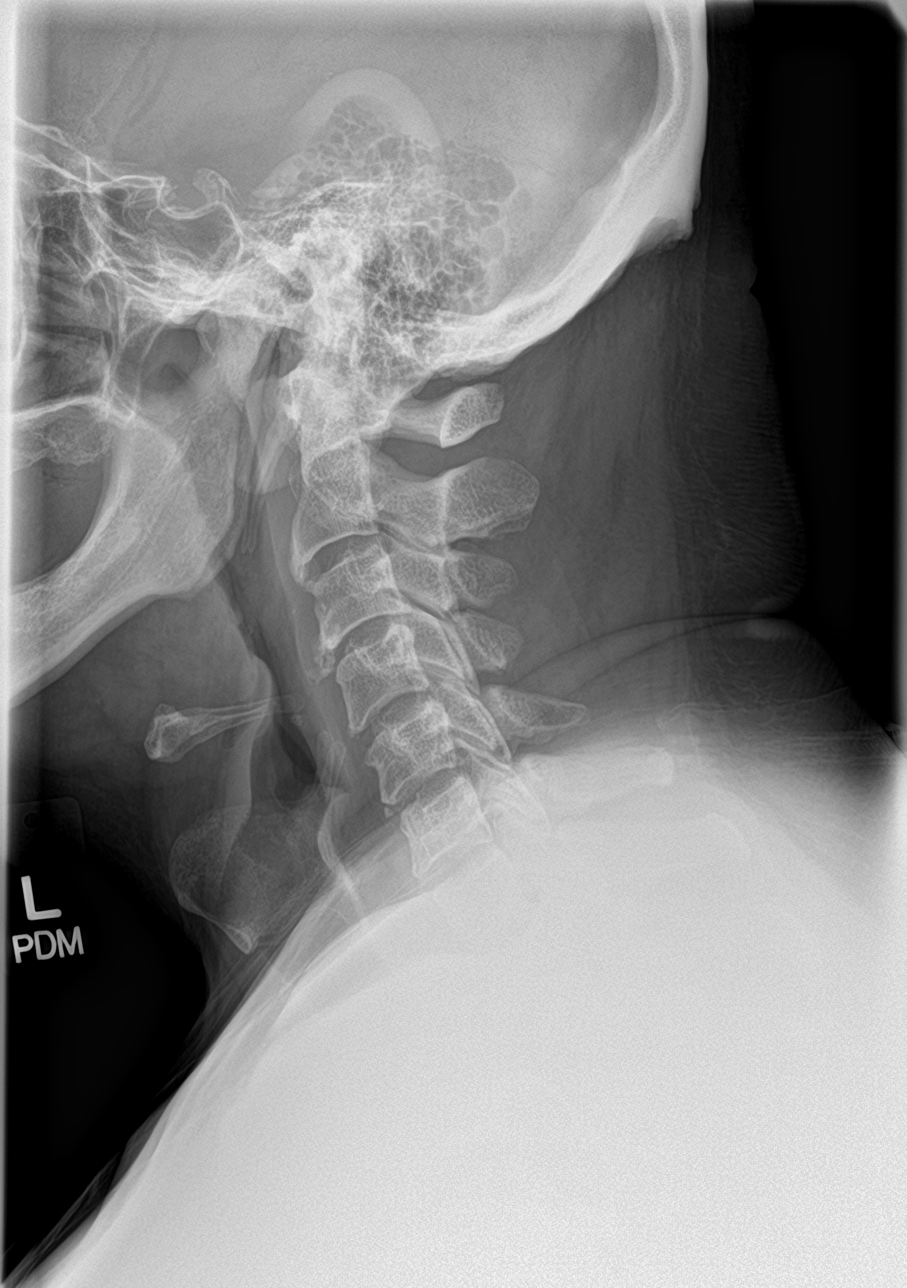

[c-spine obl (1 of 2)]
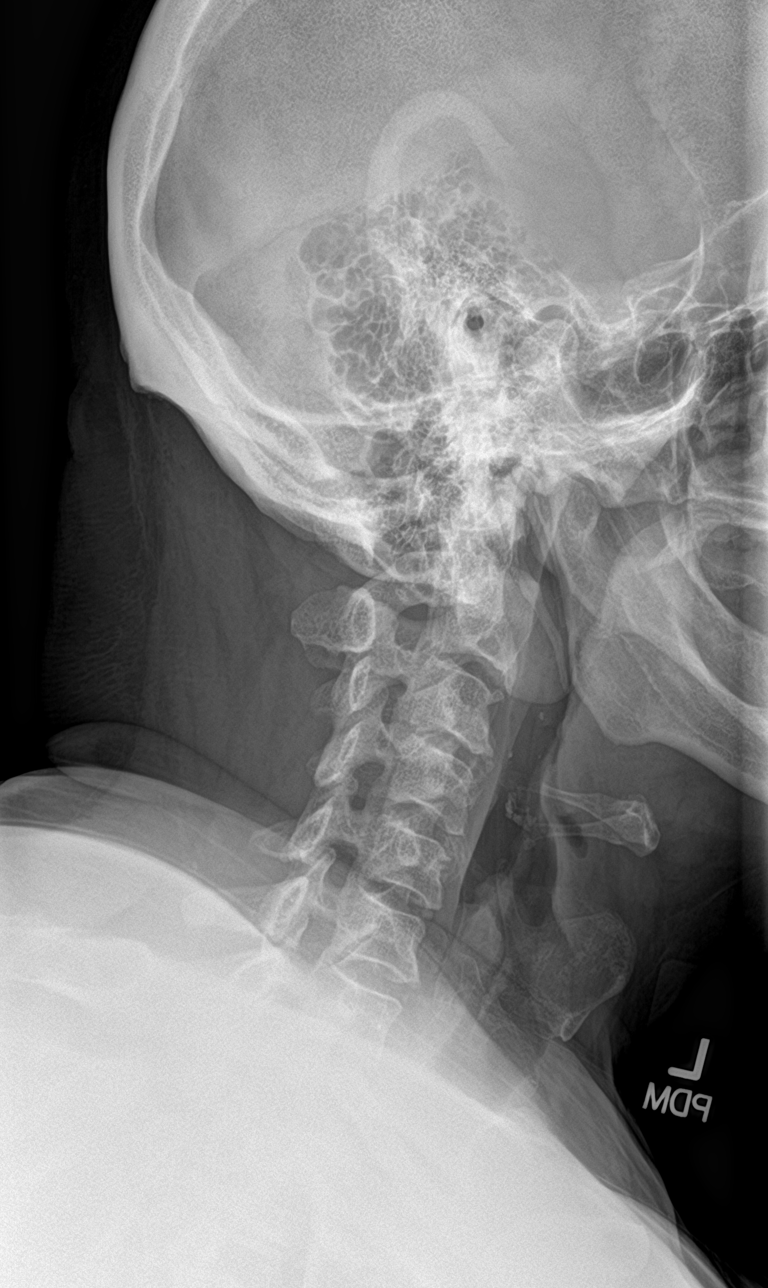

[c-spine obl (2 of 2)]
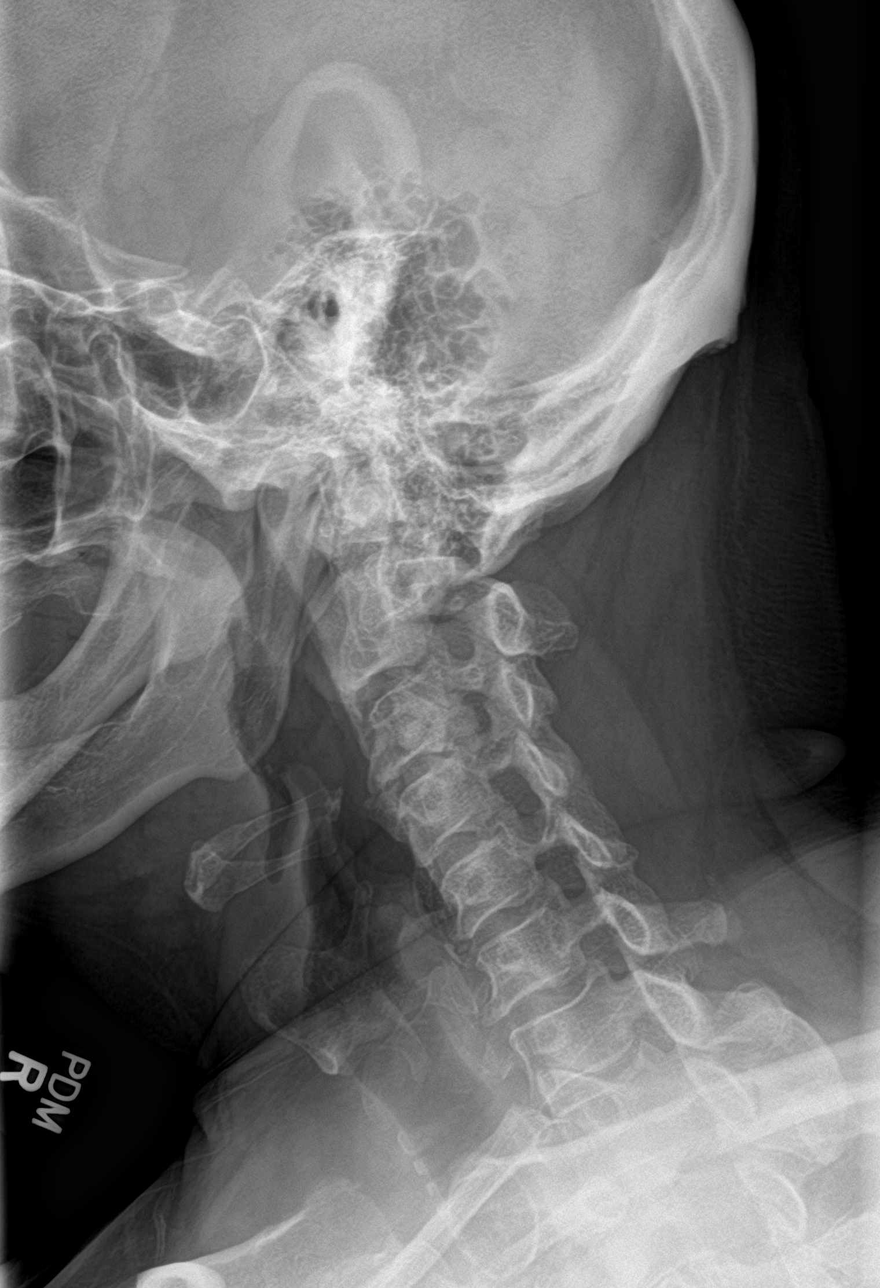

[c-spine ap]
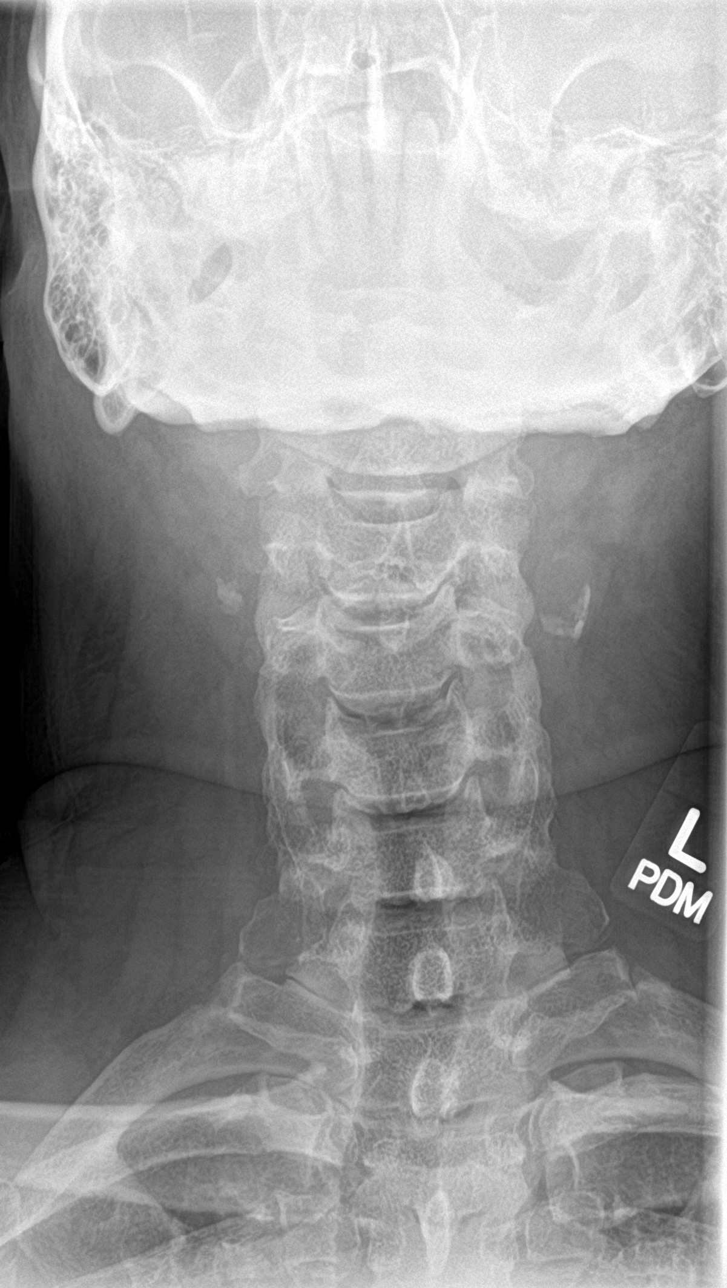

[c-spine open mouth]
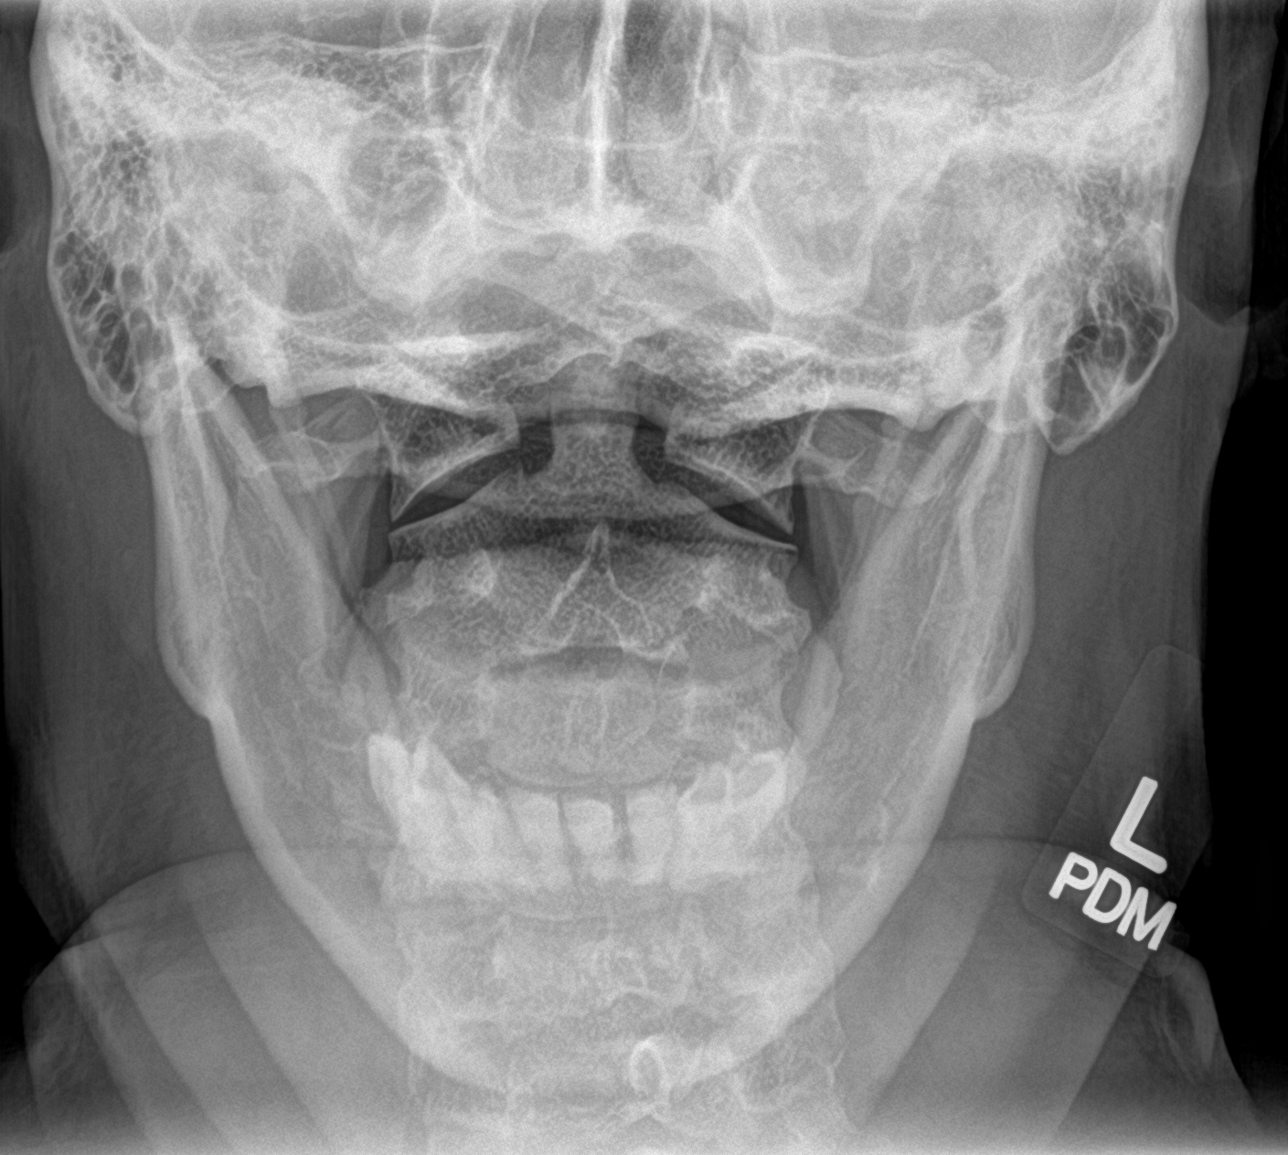

[5 of 5 positions shown; findings below may reference images not displayed]

FINDINGS: No acute fracture or dislocation identified. Normal cervical
lordosis without listhesis. Mild left curvature of the
cervicothoracic junction. Vertebral body and disc space heights are
preserved. Mild discogenic degenerative changes with marginal
osteophytes are present throughout the cervical spine. Uncovertebral
and facet hypertrophy mildly narrows the bilateral bony C3-4 neural
foramen.
IMPRESSION: 1. No acute fracture or dislocation identified.
2. Mild cervical spondylosis with bony foraminal narrowing at the
L3-4 level bilaterally.

By: Ronald Michael Montoya Huaman M.D.

## 2018-11-17 ENCOUNTER — Other Ambulatory Visit: Payer: Self-pay | Admitting: Family Medicine

## 2018-11-17 ENCOUNTER — Other Ambulatory Visit: Payer: Self-pay | Admitting: Neurology

## 2018-12-06 ENCOUNTER — Other Ambulatory Visit: Payer: Self-pay | Admitting: Family Medicine

## 2018-12-08 ENCOUNTER — Other Ambulatory Visit: Payer: Self-pay | Admitting: Family Medicine

## 2019-01-02 ENCOUNTER — Other Ambulatory Visit: Payer: Self-pay | Admitting: Family Medicine

## 2019-01-25 ENCOUNTER — Other Ambulatory Visit: Payer: Self-pay | Admitting: Family Medicine

## 2019-02-15 ENCOUNTER — Other Ambulatory Visit: Payer: Self-pay | Admitting: Family Medicine

## 2019-02-22 ENCOUNTER — Other Ambulatory Visit: Payer: Self-pay | Admitting: Family Medicine

## 2019-03-07 ENCOUNTER — Other Ambulatory Visit: Payer: Self-pay | Admitting: Family Medicine

## 2019-04-27 ENCOUNTER — Other Ambulatory Visit: Payer: Self-pay | Admitting: Family Medicine

## 2019-05-24 ENCOUNTER — Other Ambulatory Visit: Payer: Self-pay | Admitting: Neurology

## 2019-08-06 ENCOUNTER — Other Ambulatory Visit: Payer: Self-pay | Admitting: Neurology

## 2019-09-16 ENCOUNTER — Other Ambulatory Visit: Payer: Self-pay | Admitting: Neurology
